# Patient Record
Sex: Female | Born: 1965 | Race: Black or African American | Hispanic: No | Marital: Married | Smoking: Never smoker
Health system: Southern US, Community
[De-identification: ages and names within clinical notes are randomized; demographics above are authoritative.]

## PROBLEM LIST (undated history)

## (undated) DIAGNOSIS — M2550 Pain in unspecified joint: Secondary | ICD-10-CM

## (undated) DIAGNOSIS — M549 Dorsalgia, unspecified: Secondary | ICD-10-CM

## (undated) DIAGNOSIS — E785 Hyperlipidemia, unspecified: Secondary | ICD-10-CM

## (undated) DIAGNOSIS — I10 Essential (primary) hypertension: Secondary | ICD-10-CM

## (undated) DIAGNOSIS — T7840XA Allergy, unspecified, initial encounter: Secondary | ICD-10-CM

## (undated) DIAGNOSIS — E669 Obesity, unspecified: Secondary | ICD-10-CM

## (undated) HISTORY — PX: ABDOMINAL SURGERY: SHX537

## (undated) HISTORY — DX: Dorsalgia, unspecified: M54.9

## (undated) HISTORY — DX: Obesity, unspecified: E66.9

## (undated) HISTORY — PX: CHOLECYSTECTOMY: SHX55

## (undated) HISTORY — PX: ABDOMINAL HYSTERECTOMY: SHX81

## (undated) HISTORY — DX: Hyperlipidemia, unspecified: E78.5

## (undated) HISTORY — PX: OTHER SURGICAL HISTORY: SHX169

## (undated) HISTORY — DX: Pain in unspecified joint: M25.50

## (undated) HISTORY — DX: Allergy, unspecified, initial encounter: T78.40XA

## (undated) HISTORY — DX: Essential (primary) hypertension: I10

## (undated) NOTE — *Deleted (*Deleted)
Health Maintenance Due  Topic Date Due  . INFLUENZA VACCINE  02/08/2020  Please stop by lab before you go If you have mychart- we will send your results within 3 business days of [place] receiving them.  If you do not have mychart- we will call you about results within 5 business days of [place] receiving them.  *please note we are currently using [facility] which has a longer processing time than [place] typically so labs may not come back as quickly as in the past *please also note that you will see labs on mychart as soon as they post. I will later go in and write notes on them- will say "notes from Dr. Durene Cal"   Depression screen [facility] 2/9 08/04/2019 05/27/2019 04/18/2019  Decreased Interest 0 0 0  Down, Depressed, Hopeless 0 0 0  PHQ - 2 Score 0 0 0  Altered sleeping 0 - -  Tired, decreased energy 1 - -  Change in appetite 0 - -  Feeling bad or failure about yourself  0 - -  Trouble concentrating 1 - -  Moving slowly or fidgety/restless 1 - -  Suicidal thoughts 0 - -  PHQ-9 Score 3 - -  Difficult doing work/chores Not difficult at all - -

---

## 2000-07-20 ENCOUNTER — Emergency Department (HOSPITAL_COMMUNITY): Admission: EM | Admit: 2000-07-20 | Discharge: 2000-07-20 | Payer: Self-pay | Admitting: Emergency Medicine

## 2000-08-29 ENCOUNTER — Other Ambulatory Visit: Admission: RE | Admit: 2000-08-29 | Discharge: 2000-08-29 | Payer: Self-pay | Admitting: Gynecology

## 2000-10-12 ENCOUNTER — Encounter: Payer: Self-pay | Admitting: Gastroenterology

## 2000-10-12 ENCOUNTER — Ambulatory Visit (HOSPITAL_COMMUNITY): Admission: RE | Admit: 2000-10-12 | Discharge: 2000-10-12 | Payer: Self-pay | Admitting: Gastroenterology

## 2000-11-15 ENCOUNTER — Encounter (INDEPENDENT_AMBULATORY_CARE_PROVIDER_SITE_OTHER): Payer: Self-pay | Admitting: Specialist

## 2000-11-15 ENCOUNTER — Observation Stay (HOSPITAL_COMMUNITY): Admission: RE | Admit: 2000-11-15 | Discharge: 2000-11-16 | Payer: Self-pay | Admitting: General Surgery

## 2001-08-27 ENCOUNTER — Other Ambulatory Visit: Admission: RE | Admit: 2001-08-27 | Discharge: 2001-08-27 | Payer: Self-pay | Admitting: Gynecology

## 2002-09-22 ENCOUNTER — Other Ambulatory Visit: Admission: RE | Admit: 2002-09-22 | Discharge: 2002-09-22 | Payer: Self-pay | Admitting: Gynecology

## 2003-02-20 ENCOUNTER — Encounter (INDEPENDENT_AMBULATORY_CARE_PROVIDER_SITE_OTHER): Payer: Self-pay | Admitting: *Deleted

## 2003-02-20 ENCOUNTER — Inpatient Hospital Stay (HOSPITAL_COMMUNITY): Admission: RE | Admit: 2003-02-20 | Discharge: 2003-02-23 | Payer: Self-pay | Admitting: Gynecology

## 2004-09-23 ENCOUNTER — Other Ambulatory Visit: Admission: RE | Admit: 2004-09-23 | Discharge: 2004-09-23 | Payer: Self-pay | Admitting: Gynecology

## 2005-09-25 ENCOUNTER — Other Ambulatory Visit: Admission: RE | Admit: 2005-09-25 | Discharge: 2005-09-25 | Payer: Self-pay | Admitting: Gynecology

## 2007-09-26 ENCOUNTER — Other Ambulatory Visit: Admission: RE | Admit: 2007-09-26 | Discharge: 2007-09-26 | Payer: Self-pay | Admitting: Gynecology

## 2007-10-25 ENCOUNTER — Encounter: Admission: RE | Admit: 2007-10-25 | Discharge: 2007-10-25 | Payer: Self-pay | Admitting: Gynecology

## 2008-04-23 ENCOUNTER — Ambulatory Visit: Payer: Self-pay | Admitting: Women's Health

## 2008-12-10 ENCOUNTER — Encounter: Admission: RE | Admit: 2008-12-10 | Discharge: 2008-12-10 | Payer: Self-pay | Admitting: Obstetrics & Gynecology

## 2009-12-14 ENCOUNTER — Encounter: Admission: RE | Admit: 2009-12-14 | Discharge: 2009-12-14 | Payer: Self-pay | Admitting: Obstetrics & Gynecology

## 2010-11-16 ENCOUNTER — Other Ambulatory Visit: Payer: Self-pay | Admitting: Obstetrics & Gynecology

## 2010-11-25 NOTE — Op Note (Signed)
NAME:  [name] [name], [name] [name]                           ACCOUNT NO.:  [id number]   MEDICAL RECORD NO.:  [id number]                   PATIENT TYPE:  INP   LOCATION:  9303                                 FACILITY:  [facility]   PHYSICIAN:  Yaakov Guthrie. Shon Hough, M.D.           DATE OF BIRTH:  Sep 18, 1965   DATE OF PROCEDURE:  02/20/2003  DATE OF DISCHARGE:                                 OPERATIVE REPORT   PATIENT IDENTIFICATION:  This is a 45 year old lady with severe  panniculitis, abdominal dermatochalasis with severe diastasis recti.  She  has increased back pain secondary to a large flow of the tissue in the lower  back and abdominal area, and an abnormal vector.  She has a history of  intertrigo breakouts, using creams, talcs, etc., to no avail.  She also has  increased paunchiness of her belly secondary to severe stretching of her  abdominal wall muscles from previous pregnancies.   PROCEDURES DONE:  1. Abdominoplasty.  2. Panniculectomy.  3. Repair of diastasis recti.   SURGEON:  Yaakov Guthrie. Shon Hough, M.D.   ASSISTANT:  Alethia Berthold, CFA, CO PA-C   ANESTHESIA:  General.   DESCRIPTION OF PROCEDURE:  The patient underwent hysterectomy by Dr. Farrel Gobble  prior to my procedure.  The abdominal wall was then reprepped again with  Hibiclens solution, walled off with sterile towels and draped so as to make  a sterile field.  Abdominoplasty incision was outlined and tumescent  solution injected throughout the tissue.  Abdominoplasty incision was opened  down through skin and subcutaneous tissue with a #15 blade; down through  Scarpa's fascia.  Hemostasis was maintained with a Bovie unit on  coagulation.  Next the flap was dissected over the areolar tissue of the  fascia of the musculature of the abdominal wall.  This was dissected up to  the umbilicus, umbilicus released from the soft pedicles.  Then the  dissection was carried up to the xiphoid process as well as the upper right  and left upper  quadrants.  After acquiring hemostasis, the severe diastasis  recti was examined and then was repaired with a running suture of #1  Prolene.  At the xiphoid process down to the umbilicus, from the umbilicus  down to the suprapubic area.  After allowing for hemostasis, the patient was  placed in a jackknife position.  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  After being happy with the symmetry, subcutaneous closure  was done on the flaps with 2-0 Monocryl x2 layers, and a running  subcuticular stitches of 3-0 Monocryl.  The wound was then drained with a  large Hemovac, which was threaded throughout the wound and brought out  through the suprapubic area; then secured with 3-0 Prolene.  A  new area was  made through the abdominal area and the umbilicus was also pushed back  through and secured with 3-0 Vicryl; and then a running subcuticular stitch  with 3-0 Vicryl.  Steri-Strips and soft dressings were applied about the  areas.  She withstood the procedure very well and was taken to recovery in  excellent condition.    ESTIMATED BLOOD LOSS:  150 cc.   COMPLICATIONS:  None.                                               Yaakov Guthrie. Shon Hough, M.D.    GLT/MEDQ  D:  02/20/2003  T:  02/21/2003  Job:  [id number]   cc:   ATTN Dr. Shon Hough [facility]   Ivor Costa. Farrel Gobble, M.D.  [address], [address]  [place]  [place] [address]  Fax: [phone]

## 2010-11-25 NOTE — Discharge Summary (Signed)
   NAME:  [name] [name], [name] [name]                           ACCOUNT NO.:  [id number]   MEDICAL RECORD NO.:  [id number]                   PATIENT TYPE:  INP   LOCATION:  9144                                 [facility]ACILITY:  [facility]   PHYSICIAN:  Ivor Costa. Farrel Gobble, M.D.              DATE OF BIRTH:  02-12-1966   DATE OF ADMISSION:  02/20/2003  DATE OF DISCHARGE:  02/23/2003                                 DISCHARGE SUMMARY   DISCHARGE DIAGNOSIS:  Symptomatic fibroid uterus status post total abdominal  hysterectomy, left salpingo-oophorectomy by Dr. Douglass Rivers on 02/20/2003.   HISTORY OF PRESENT ILLNESS:  The patient is a 45 year old female gravida 3,  para 2 with a large symptomatic fibroid uterus with significant urinary  frequency secondary to pressure complaining of clotting, changing pads on a  very frequent basis.  Also has had a history of anemia for a number of years  instead of taking iron supplementation.  She is status post Depo Lupron  therapy for the past four months in order to prepare for surgery.   HOSPITAL COURSE:  On 02/20/2003, the patient was admitted and underwent a  total abdominal hysterectomy with left salpingo-oophorectomy by Dr. Douglass Rivers.  Simultaneously, she did have plastic surgery by Dr. Louisa Second, with abdominoplasty, panniculectomy, and repair of diastasis  recti.  Postoperatively, the patient remained afebrile, voiding, and in  stable condition.  Her wound care was managed by Dr. Shon Hough, plastic  surgeon, and she was stable for discharge on 02/23/2003 in satisfactory  condition.   LABORATORY DATA:  Hemoglobin 10 on 02/21/2003.   DISPOSITION:  The patient is discharged to home.   FOLLOW UP:  She is to return to the office in two weeks for postoperative  visit with GYN.  Note:  She is going to follow up with Dr. Shon Hough after  discharge on 02/24/2003 with a binder in place.   DISCHARGE MEDICATIONS:  She was given a prescription for Tylox p.r.n.  pain.      Susa Loffler, P.A.                    Ivor Costa. Farrel Gobble, M.D.    TSG/MEDQ  D:  03/10/2003  T:  03/10/2003  Job:  [id number]

## 2010-11-25 NOTE — Op Note (Signed)
NAME:  [name] [name], [name] [name]                           ACCOUNT NO.:  [id number]   MEDICAL RECORD NO.:  [id number]                   PATIENT TYPE:  INP   LOCATION:  9399                                 [facility]ILITY[facility]   PHYSICIAN:  Ivor Costa. Farrel Gobble, M.D.              DATE OF BIRTH:  April 13, 1966   DATE OF PROCEDURE:  02/20/2003  DATE OF DISCHARGE:                                 OPERATIVE REPORT   PREOPERATIVE DIAGNOSIS:  Fibroid uterus.   POSTOPERATIVE DIAGNOSIS:  Fibroid uterus.   PROCEDURE:  Total abdominal hysterectomy, left salpingo-oophorectomy.   SURGEON:  Ivor Costa. Farrel Gobble, M.D.   ASSISTANT:  Rande Brunt. Eda Paschal, M.D.   ANESTHESIA:  General.   FLUIDS REPLACED:  2 liters lactated ringers.   ESTIMATED BLOOD LOSS:  200 mL.   URINE OUTPUT:  350 mL clear.   FINDINGS:  Multifibroid uterus with distorted anatomy.   PATHOLOGY:  Uterus, cervix, left tube and ovary.   COMPLICATIONS:  None.   PROCEDURE:  The patient was taken to the operating room, general anesthesia  was induced, placed in the supine position, prepped and draped in the usual  sterile fashion.  A Pfannenstiel skin incision was made with the scalpel  going through the previous C-section scar X 2 and carried through the  underlying layer of fascia which was scored in the midline.  The fascial  incision was then extended laterally.  The rectus muscles were identified  and scored with the electrocautery with careful attention to any of the  dissecting vessels.  The rectus muscles were then deviated medially at the  edge.  The inferior epigastric vessels were visualized, elevated,  crossclamped, transected, and suture ligated with 0 Vicryl.  This was  performed bilaterally.  After this point, the muscle dissection was extended  to the lateral edge.  This was also performed bilaterally.  The muscles were  noted to be hemostatic.  The peritoneum was identified, entered bluntly, and  then the peritoneal incision in a  similar fashion was also extended  laterally in line with the incision.  The uterus was visualized.  The uterus  was able to be exteriorized.   The anatomy was markedly distorted secondary to the fibroids.  The left tube  had evidence of the previous tubal ligation.  The round ligament on the left  was really unable to be identified initially.  The ovary was markedly  scarred to what was felt to be the fundal region.  After some inspection,  the round ligament was identified, crossclamped, and then sharply dissected  with the electrocautery.  The anterior leaf of the broad ligament was  partially incised as was the posterior.  It was felt that the ovary was  markedly scarred and needed to be sharply dissected off the uterine fundus.  The ovary was elevated and a Heaney clamp was placed underneath.  A second  Heaney was placed for  back bleeding.  The ovary was begun to be sharply  dissected off.  The tubo-ovarian ligament was never truly identified and  while the ovary was carefully being dissected off, we noted that we had  incidentally lost the ovarian blood supply and it began to get dusky.  Based  on that, the ovary was sharply dissected off.  At this point, we had not  identified the infundibulopelvic ligament, however, there was no bleeding.  The uterus was then extra rotated, the anatomy on the sidewall was  distorted, it was certainly easier.  The round ligament was grasped and  transected with the electrocautery and noted to be hemostatic.  The anterior  leaf of the broad ligament was incised and carried through and although the  dissection on the left side appeared to be markedly lateral, it was apparent  that it did properly create the bladder flap because of distortion.  The  bladder nicely came down sharply.  The tube was also close to the uterus,  however, the tubo-ovarian ligament was able to be identified, this was  clamped, transected, free tied and suture ligature of 0  Vicryl were placed.  The ovary was then able to be dissected off the uterus with careful  attention to its vascular supply.  The posterior leaf of the broad ligament  was then incised and the ovary was able to be safely packed away.  Of note,  the ureter was identified on the left prior to removal of the ovary because  we felt that we could not actually secure the IP ligament.   The attention was turned to the left hand side.  We never saw any bleeding,  but we had sacrificed the IP and there was no hematoma forming.  The  attention was turned back towards the right as the anatomy was a little  easier to identify.  The fibroid was stabilized and elevated.  The uterine  vessels were then able to be visualized.  The peritoneum was sharply taken  down with careful attention to the uterine vessels which, at this point had  not been identified.  Once the anterior and posterior leaf of the broad  ligament were further taken down and we were clear of the bladder  anteriorly, a Masterson was placed across what was felt to be the uterine  vessels which were transected and suture ligated.  There was a marked amount  of back bleeding.  The uterus was injected with dilute Pitressin on the left  hand side so that we could better visualize the anatomy.  The patient was  given a total of 10 mL of a dilute solution of 10 to 50.  We were able to  identify the fact that the infundibulopelvic ligament had been transected  and suture ligated with the round and was markedly deviated anteriorly as  the uterus had been markedly rotated.  What we felt was a broad ligament  fibroid on the left in reality turned out to be the cervix markedly deviated  towards the patient's left.  Onc we secured this, the round ligaments were  able to be identified, cross clamped, suture ligated with 0 Vicryl.  Once  both round ligaments were secured, the fundus was dissected off and the anatomy was certainly markedly easier to  identify.  The cardinal ligaments  were then sharply dissected down, suture ligated with 0 Vicryl bilaterally.  The dissection was carried through to include the uterosacral ligaments.  The vagina was then entered  sharply and the cervix was sharply dissected  off.  The vaginal mucosa was transfixed to the ipsilateral cardinal ligament  and everted.  This was done bilaterally.  The vagina was closed in a  baseball fashion with 0 Vicryl.   The pelvis was then irrigated with copious amounts of warm saline, as the  anatomy on the left had been somewhat distorted, the ureter was again  visualized and peristalsis was noted.  The pedicles were noted, there was a  small amount of bleeding at the left angle that was treated with a figure-of-  eight of 0 Vicryl.  The pedicles on the right were also inspected and noted  to be hemostatic.  Of note, after the fundus was removed, the Lenox Ahr was placed in the incision because it was a Maylard incision.  We  placed a lap sponge laterally to avoid pressure on the psoas muscle.  The  pelvis was then irrigated with copious amounts of warm saline.  Reinspection  of the pedicle insured [place] of hemostasis.  The retractor was then removed.  The peritoneum was grasped with Kelly clamps and was plicated in the  midline, however, it was markedly thinned and noted to rip.  This was,  therefore, only carried through to the mid portion.  The fascia, muscle, and  peritoneum were closed together starting at the left angle, the right angle,  to the midline and similarly on the left, although some of the peritoneum  had been previously closed.  The incision was irrigated, hemostasis was  assured.  It was left open as Dr. Shon Hough had come in at this point to  start an abdominoplasty.                                              Ivor Costa. Farrel Gobble, M.D.   THL/MEDQ  D:  02/20/2003  T:  02/20/2003  Job:  [id number]

## 2010-11-25 NOTE — H&P (Signed)
NAME:  [name], [name]                           ACCOUNT NO.:  [id number]   MEDICAL RECORD NO.:  [id number]                   PATIENT TYPE:  AMB   LOCATION:  SDC                                  [facility]   PHYSICIAN:  Ivor Costa. Farrel Gobble, M.D.              DATE OF BIRTH:  [birth year]   DATE OF ADMISSION:  02/20/2003  DATE OF DISCHARGE:                                HISTORY & PHYSICAL   CHIEF COMPLAINT:  Symptomatic fibroid uterus.   HISTORY OF PRESENT ILLNESS:  The patient is a 45 year old G3, P2 with large  symptomatic fibroid uterus.  The patient states that she has urinary  frequency secondary to pressure.  She also states that her periods last for  about seven days.  She complains of clots and changing pads on a frequent  basis.  She has had mild anemia for a number of years despite taking iron  supplementation which is felt to be secondary to her fibroids.  The  patient's hemoglobin runs in the range of approximately 10.  The patient  elects to undergo definitive surgery in the form of an abdominal  hysterectomy.  She is status post Lupron Depot therapy for the past four  months in order to prepare her for surgery.  Currently the patient only  complains of hot flashes and night sweats.  Her urinary symptoms are  somewhat better.  She has not noticed any vaginal discharge.   PAST OB/GYN HISTORY:  1. Two cesarean sections.  2. Menses as above.  3. Pap smear in March 2004 was normal.  She has no history of abnormal Pap     smears.  4. Her contraception is a tubal ligation.   PAST MEDICAL HISTORY:  Negative.   PAST SURGICAL HISTORY:  1. Significant for cesarean sections in 1987 and 1993.  2. She had a cholecystectomy done in 2002, laparoscopic.   MEDICATIONS:  1. Lupron Depot.  2. Iron.   ALLERGIES:  Negative.   SOCIAL HISTORY:  Social alcohol.  No tobacco.  No caffeine.  Some exercise.   PHYSICAL EXAMINATION:  GENERAL:  She is a well-appearing female in no acute  distress.  HEART:  Regular rhythm.  LUNGS:  Clear to auscultation.  ABDOMEN:  Soft, nontender without rebound or guarding.  Her uterus is  palpable approximately three finger breadths below the umbilicus.  PELVIC:  She has normal external female genitalia.  The BUS is negative.  The vagina was pink and moist.  The cervix is without lesions.  Bi-manual  exam, the multi-fibroid irregular uterus is palpable three finger breadths  below the umbilicus.  A large fibroid in the lower uterine segment is also  palpable.  Rectovaginal exam was deferred.  EXTREMITIES:  Negative.   Her ultrasound, prior to the Lupron, shows her uterus to be 22 x 9 x 14-cm  with seven distinct fibroids that were visualized that measured between  3.5-  7-cm.  The ovaries were not able to be identified.   ASSESSMENT:  Symptomatic fibroid uterus, status post Lupron Depot.   The patient will present for a total abdominal hysterectomy.  She will also  have an abdominoplasty done at the same time by Dr. Shon Hough, all questions  were addressed and she will present electively in the morning for surgery.  Her CBC is pending at the time of this dictation.                                               Ivor Costa. Farrel Gobble, M.D.    THL/MEDQ  D:  02/11/2003  T:  02/11/2003  Job:  [id number]

## 2010-11-25 NOTE — Op Note (Signed)
[facility]  Patient:    [name] [name], [name] [name]                       MRN: [id number] Proc. Date: 11/15/00 Adm. Date:  [id number] Attending:  Arlis Porta CC:         Douglass Rivers, M.D.  Anselmo Rod, M.D.   Operative Report  PREOPERATIVE DIAGNOSIS:  Biliary dyskinesia.  POSTOPERATIVE DIAGNOSIS:  Biliary dyskinesia.  PROCEDURE:  Laparoscopic cholecystectomy.  SURGEON:  Dr. Abbey Chatters.  ASSISTANT:  Cyndia Bent, M.D.  ANESTHESIA:  General.  INDICATIONS FOR PROCEDURE:  This 45 year old female has post prandial right upper quadrant epigastric pain that radiates around to her right flank. She has tried proton pump inhibitors and this has not helped her pain. It tends to be particularly bad after eating a spicy meal or a fatty meal. An abdominal ultrasound demonstrated no gallstones; however, a nuclear medicine hepatobiliary scan demonstrated an abnormal ejection fraction. Findings of this were consistent with biliary dyskinesia. She now presents for elective cholecystectomy. Her preoperative liver function tests are normal. She is noted to have a preop hemoglobin of 10.5; however she has a chronic anemia.  TECHNIQUE:  She is placed supine on the operating table and a general anesthetic was administered. The abdomen was sterilely prepped and draped. A local anesthetic was infiltrated in the subumbilical region and a 2 cm subumbilical incision was made incising the skin and subcutaneous tissue sharply. The subumbilical fascia was grasped and an incision made in the fascia of 1 cm in size. The incision was dilated and the peritoneal cavity was entered bluntly and under direct vision. A pursestring suture of #0 Vicryl was placed around thea fascial edges. A Hasson trocar was then introduced into the peritoneal cavity and a pneumoperitoneum created by insufflation of CO2 gas. Next a laparoscope was introduced. Lower abdominal adhesions were  noted. She was placed in the appropriate position and under direct vision, an 11 mm trocar is placed through an epigastric incision and two 5 mm trocars were placed through a right abdominal incision. The fundus of the gallbladder was grasped and retracted toward the right shoulder and the infundibulum was grasped and retracted toward the right. Using blunt dissection, I was able to identify the gallbladder and cystic duct junction. There was a very small diameter cystic duct. I isolated the region, clipped it three times proximally and once distally and divided the cystic duct. The cystic artery was identified, clipped and divided. The gallbladder was then dissected free from the liver bed intact. No bile leakage was noted. The liver bed was inspected, irrigated and bleeding points controlled with the cautery. The gallbladder fossa was once again inspected and there was no noted bile leakage or bleeding. The gallbladder was then removed to the subumbilical port. The subumbilical fascial defect was closed by tightening up and tying down the pursestring suture. The rest of the trocars were removed and the pneumoperitoneum was released. Subsequently the skin incisions were closed with 4-0 monocryl subcuticular stitches followed by Steri-Strips and sterile dressings.  She tolerated the procedure well without any apparent complications and was taken to the recovery room in satisfactory condition. DD:  11/15/00 TD:  11/15/00 Job: [id number]

## 2011-01-10 ENCOUNTER — Other Ambulatory Visit: Payer: Self-pay | Admitting: Obstetrics & Gynecology

## 2011-01-10 DIAGNOSIS — Z1231 Encounter for screening mammogram for malignant neoplasm of breast: Secondary | ICD-10-CM

## 2011-01-16 ENCOUNTER — Ambulatory Visit: Payer: Self-pay

## 2011-01-18 ENCOUNTER — Ambulatory Visit
Admission: RE | Admit: 2011-01-18 | Discharge: 2011-01-18 | Disposition: A | Discharge status: Home / Self Care | Payer: BC Managed Care – PPO | Source: Ambulatory Visit | Attending: Obstetrics & Gynecology | Admitting: Obstetrics & Gynecology

## 2011-01-18 DIAGNOSIS — Z1231 Encounter for screening mammogram for malignant neoplasm of breast: Secondary | ICD-10-CM

## 2013-10-15 ENCOUNTER — Other Ambulatory Visit: Payer: Self-pay

## 2013-10-15 DIAGNOSIS — Z1231 Encounter for screening mammogram for malignant neoplasm of breast: Secondary | ICD-10-CM

## 2013-10-20 ENCOUNTER — Ambulatory Visit: Payer: BC Managed Care – PPO

## 2013-10-27 ENCOUNTER — Ambulatory Visit
Admission: RE | Admit: 2013-10-27 | Discharge: 2013-10-27 | Disposition: A | Discharge status: Home / Self Care | Payer: BC Managed Care – PPO | Source: Ambulatory Visit

## 2013-10-27 DIAGNOSIS — Z1231 Encounter for screening mammogram for malignant neoplasm of breast: Secondary | ICD-10-CM

## 2014-04-21 ENCOUNTER — Encounter: Payer: Self-pay | Admitting: Family

## 2014-04-21 ENCOUNTER — Ambulatory Visit (INDEPENDENT_AMBULATORY_CARE_PROVIDER_SITE_OTHER): Payer: BC Managed Care – PPO | Admitting: Family

## 2014-04-21 VITALS — BP 118/80 | HR 67 | Ht 66.75 in | Wt 217.0 lb

## 2014-04-21 DIAGNOSIS — Z23 Encounter for immunization: Secondary | ICD-10-CM

## 2014-04-21 DIAGNOSIS — Z0000 Encounter for general adult medical examination without abnormal findings: Secondary | ICD-10-CM

## 2014-04-21 LAB — POCT URINALYSIS DIPSTICK
BILIRUBIN UA: NEGATIVE
Glucose, UA: NEGATIVE
KETONES UA: NEGATIVE
LEUKOCYTES UA: NEGATIVE
Nitrite, UA: NEGATIVE
PH UA: 5.5
Protein, UA: NEGATIVE
Spec Grav, UA: 1.01
Urobilinogen, UA: 0.2

## 2014-04-21 LAB — CBC WITH DIFFERENTIAL/PLATELET
BASOS ABS: 0 10*3/uL (ref 0.0–0.1)
Basophils Relative: 0.8 % (ref 0.0–3.0)
Eosinophils Absolute: 0.2 10*3/uL (ref 0.0–0.7)
Eosinophils Relative: 3.9 % (ref 0.0–5.0)
HEMATOCRIT: 37.6 % (ref 36.0–46.0)
Hemoglobin: 12.2 g/dL (ref 12.0–15.0)
LYMPHS ABS: 2.6 10*3/uL (ref 0.7–4.0)
Lymphocytes Relative: 47 % — ABNORMAL HIGH (ref 12.0–46.0)
MCHC: 32.5 g/dL (ref 30.0–36.0)
MCV: 92.4 fl (ref 78.0–100.0)
MONO ABS: 0.3 10*3/uL (ref 0.1–1.0)
MONOS PCT: 5.9 % (ref 3.0–12.0)
Neutro Abs: 2.3 10*3/uL (ref 1.4–7.7)
Neutrophils Relative %: 42.4 % — ABNORMAL LOW (ref 43.0–77.0)
PLATELETS: 247 10*3/uL (ref 150.0–400.0)
RBC: 4.07 Mil/uL (ref 3.87–5.11)
RDW: 13.1 % (ref 11.5–15.5)
WBC: 5.5 10*3/uL (ref 4.0–10.5)

## 2014-04-21 LAB — COMPREHENSIVE METABOLIC PANEL
ALK PHOS: 67 U/L (ref 39–117)
ALT: 19 U/L (ref 0–35)
AST: 19 U/L (ref 0–37)
Albumin: 3.5 g/dL (ref 3.5–5.2)
BILIRUBIN TOTAL: 0.5 mg/dL (ref 0.2–1.2)
BUN: 13 mg/dL (ref 6–23)
CO2: 28 mEq/L (ref 19–32)
Calcium: 9.8 mg/dL (ref 8.4–10.5)
Chloride: 106 mEq/L (ref 96–112)
Creatinine, Ser: 0.8 mg/dL (ref 0.4–1.2)
GFR: 102.63 mL/min (ref >60.00)
Glucose, Bld: 92 mg/dL (ref 70–99)
Potassium: 4.2 mEq/L (ref 3.5–5.1)
SODIUM: 140 meq/L (ref 135–145)
TOTAL PROTEIN: 7.9 g/dL (ref 6.0–8.3)

## 2014-04-21 LAB — LIPID PANEL
Cholesterol: 178 mg/dL (ref 0–200)
HDL: 41.4 mg/dL (ref >39.00)
LDL Cholesterol: 118 mg/dL — ABNORMAL HIGH (ref 0–99)
NONHDL: 136.6
Total CHOL/HDL Ratio: 4
Triglycerides: 91 mg/dL (ref 0.0–149.0)
VLDL: 18.2 mg/dL (ref 0.0–40.0)

## 2014-04-21 LAB — TSH: TSH: 1.46 u[IU]/mL (ref 0.35–4.50)

## 2014-04-21 NOTE — Progress Notes (Signed)
Pre visit review using our clinic review tool, if applicable. No additional management support is needed unless otherwise documented below in the visit note. 

## 2014-04-21 NOTE — Progress Notes (Signed)
   Subjective:    Patient ID: [name] [name], female    DOB: 07/21/1965, 48 y.o.   MRN: [id number]  HPI  48 year old [place], nonsmoker, is in today to be established and for complete physical exam. She denies any concerns. Sees gynecology for female care. Last Pap smear and mammogram were normal in April 2015. Does not routinely exercise.  Review of Systems  Constitutional: Negative.   HENT: Negative.   Eyes: Negative.   Respiratory: Negative.   Cardiovascular: Negative.   Gastrointestinal: Negative.   Endocrine: Negative.   Genitourinary: Negative.   Musculoskeletal: Negative.   Skin: Negative.   Allergic/Immunologic: Negative.   Neurological: Negative.   Hematological: Negative.   Psychiatric/Behavioral: Negative.    Past Medical History  Diagnosis Date  . Hypertension   . Hyperlipidemia     History   Social History  . Marital Status: Divorced    Spouse Name: N/A    Number of Children: N/A  . Years of Education: N/A   Occupational History  . Not on file.   Social History Main Topics  . Smoking status: Never Smoker   . Smokeless tobacco: Not on file  . Alcohol Use: Yes  . Drug Use: No  . Sexual Activity: Not on file   Other Topics Concern  . Not on file   Social History Narrative  . No narrative on file    Past Surgical History  Procedure Laterality Date  . Cholecystectomy    . Abdominal hysterectomy      Family History  Problem Relation Age of Onset  . Heart disease Mother   . Heart attack Mother   . Hypertension Mother   . Diabetes Mother   . Diabetes Sister     No Known Allergies  No current outpatient prescriptions on file prior to visit.   No current facility-administered medications on file prior to visit.    BP 118/80  Pulse 67  Ht 5' 6.75" (1.695 m)  Wt 217 lb (98.431 kg)  BMI 34.26 kg/m2chart    Objective:   Physical Exam  Constitutional: She is oriented to person, place, and time. She appears  well-developed and well-nourished.  HENT:  Head: Normocephalic and atraumatic.  Right Ear: External ear normal.  Left Ear: External ear normal.  Nose: Nose normal.  Mouth/Throat: Oropharynx is clear and moist.  Eyes: Conjunctivae and EOM are normal. Pupils are equal, round, and reactive to light.  Neck: Normal range of motion. Neck supple. No thyromegaly present.  Cardiovascular: Normal rate, regular rhythm and normal heart sounds.   Pulmonary/Chest: Effort normal and breath sounds normal.  Abdominal: Soft. Bowel sounds are normal.  Genitourinary:  Deferred to GYN  Musculoskeletal: Normal range of motion. She exhibits no edema and no tenderness.  Neurological: She is alert and oriented to person, place, and time. She has normal reflexes. She displays normal reflexes. No cranial nerve deficit. Coordination normal.  Skin: Skin is warm and dry.  Psychiatric: She has a normal mood and affect.          Assessment & Plan:  [name] [name] was seen today for establish care.  Diagnoses and associated orders for this visit:  Preventative health care - EKG 12-Lead - Lipid Panel - CBC with Differential - CMP - POC Urinalysis Dipstick - TSH    Encouraged a healthy diet and exercise, monthly self breast exams. Heart healthy diet. Call the office with any questions or concerns. Recheck

## 2014-04-21 NOTE — Addendum Note (Signed)
Addended by: Beverely LowFRAZIER, Heywood Tokunaga L on: 04/21/2014 10:11 AM   Modules accepted: Orders

## 2014-04-21 NOTE — Patient Instructions (Signed)
Exercise to Stay Healthy Exercise helps you become and stay healthy. EXERCISE IDEAS AND TIPS Choose exercises that:  You enjoy.  Fit into your day. You do not need to exercise really hard to be healthy. You can do exercises at a slow or medium level and stay healthy. You can:  Stretch before and after working out.  Try yoga, Pilates, or tai chi.  Lift weights.  Walk fast, swim, jog, run, climb stairs, bicycle, dance, or rollerskate.  Take aerobic classes. Exercises that burn about 150 calories:  Running 1  miles in 15 minutes.  Playing volleyball for 45 to 60 minutes.  Washing and waxing a car for 45 to 60 minutes.  Playing touch football for 45 minutes.  Walking 1  miles in 35 minutes.  Pushing a stroller 1  miles in 30 minutes.  Playing basketball for 30 minutes.  Raking leaves for 30 minutes.  Bicycling 5 miles in 30 minutes.  Walking 2 miles in 30 minutes.  Dancing for 30 minutes.  Shoveling snow for 15 minutes.  Swimming laps for 20 minutes.  Walking up stairs for 15 minutes.  Bicycling 4 miles in 15 minutes.  Gardening for 30 to 45 minutes.  Jumping rope for 15 minutes.  Washing windows or floors for 45 to 60 minutes. Document Released: 07/29/2010 Document Revised: 09/18/2011 Document Reviewed: 07/29/2010 ExitCare Patient Information 2015 [employer]. This information is not intended to replace advice given to you by your health care provider. Make sure you discuss any questions you have with your health care provider.  

## 2014-04-29 ENCOUNTER — Telehealth: Payer: Self-pay | Admitting: Family

## 2014-04-29 NOTE — Telephone Encounter (Signed)
Pt is aware.  

## 2014-04-29 NOTE — Telephone Encounter (Signed)
Pt would like to know if she needs to have another Pertussis vaccination??

## 2014-04-29 NOTE — Telephone Encounter (Signed)
No. She just had one 04/21/2014.

## 2014-05-11 ENCOUNTER — Encounter: Payer: Self-pay | Admitting: Family

## 2014-06-16 ENCOUNTER — Telehealth: Payer: Self-pay | Admitting: Family

## 2014-06-16 NOTE — Telephone Encounter (Signed)
Pt called to ask if [name] [name] would write her a rx for some weight loss pills.

## 2014-06-16 NOTE — Telephone Encounter (Signed)
Needs appointment

## 2014-06-17 NOTE — Telephone Encounter (Signed)
S/w pt she will call back next week and schedule an appt .

## 2014-10-15 ENCOUNTER — Ambulatory Visit: Payer: Self-pay | Admitting: Family Medicine

## 2014-10-15 DIAGNOSIS — Z0289 Encounter for other administrative examinations: Secondary | ICD-10-CM

## 2014-10-20 ENCOUNTER — Ambulatory Visit (INDEPENDENT_AMBULATORY_CARE_PROVIDER_SITE_OTHER): Payer: BLUE CROSS/BLUE SHIELD | Admitting: Family Medicine

## 2014-10-20 ENCOUNTER — Encounter: Payer: Self-pay | Admitting: Family Medicine

## 2014-10-20 VITALS — BP 142/92 | HR 64 | Temp 98.3°F | Wt 217.0 lb

## 2014-10-20 DIAGNOSIS — E785 Hyperlipidemia, unspecified: Secondary | ICD-10-CM | POA: Diagnosis not present

## 2014-10-20 DIAGNOSIS — R312 Other microscopic hematuria: Secondary | ICD-10-CM

## 2014-10-20 DIAGNOSIS — E669 Obesity, unspecified: Secondary | ICD-10-CM | POA: Diagnosis not present

## 2014-10-20 DIAGNOSIS — R030 Elevated blood-pressure reading, without diagnosis of hypertension: Secondary | ICD-10-CM | POA: Diagnosis not present

## 2014-10-20 DIAGNOSIS — R3129 Other microscopic hematuria: Secondary | ICD-10-CM

## 2014-10-20 LAB — URINALYSIS, MICROSCOPIC ONLY: RBC / HPF: NONE SEEN (ref 0–?)

## 2014-10-20 NOTE — Progress Notes (Signed)
  [name] ConchStephen Hunter, MD Phone: [phone]  Subjective:  Patient presents today to establish care with me as their new primary care provider. Patient was formerly a patient of Dr. Orvan Falconerampbell DNP. Chief complaint-noted.   Hyperlipidemia-mild poor control  Obesity- poor control Lab Results  Component Value Date   LDLCALC 118* 04/21/2014   On statin: no Regular exercise: no Diet: poor choices ROS- no chest pain or shortness of breath. No myalgias  Elevated blood pressure-poor control  BP Readings from Last 3 Encounters:  10/20/14 142/92  04/21/14 118/80   Home BP monitoring-no Enjoys going to track 0-1x a week.  Compliant with medications-no rx.  ROS-Denies any CP, HA, SOB, blurry vision, LE edema, transient weakness, orthopnea, PND.   The following were reviewed and entered/updated in epic: Past Medical History  Diagnosis Date  . Hypertension   . Hyperlipidemia    Patient Active Problem List   Diagnosis Date Noted  . Hyperlipidemia 10/20/2014   Past Surgical History  Procedure Laterality Date  . Cholecystectomy    . Abdominal hysterectomy      Family History  Problem Relation Age of Onset  . Heart disease Mother   . Heart attack Mother   . Hypertension Mother   . Diabetes Mother   . Diabetes Sister    Medications- reviewed and updated. No current rx  Allergies-reviewed and updated No Known Allergies  History   Social History  . Marital Status: Divorced    Spouse Name: N/A  . Number of Children: N/A  . Years of Education: N/A   Social History Main Topics  . Smoking status: Never Smoker   . Smokeless tobacco: Not on file  . Alcohol Use: Yes  . Drug Use: No  . Sexual Activity: Not on file   Other Topics Concern  . None   Social History Narrative    ROS--See HPI   Objective: BP 142/92 mmHg  Pulse 64  Temp(Src) 98.3 F (36.8 C)  Wt 217 lb (98.431 kg) Gen: NAD, resting comfortably on table CV: RRR no murmurs rubs or gallops Lungs: CTAB no  crackles, wheeze, rhonchi Abdomen: soft/nontender/nondistended/normal bowel sounds. No rebound or guarding.  Ext: no edema Skin: warm, dry, no rash Neuro: grossly normal, moves all extremities, normal gait  Assessment/Plan:  Hyperlipidemia Obesity LDL 110s. 10 year cva/mi risk <2% in 2016. Discussed need for healthy eating/exercise/weight loss to prevent need for medication in the future. I may use a 5% cut off for patient with family history of MI in her mother in 40s-patient currently asymptomatic.   Hematuria on dipstick.  S: 1+ blood on dipstick, does not have periods.  A/P: check urine micro  Elevated blood pressure-poor control . Technically 1/2 readings so does not meet qualifications for HTN.  -advised dash diet, weight loss in relation to BP, HLD, and obesity.   Dating since last May. Has not had STD testing but likely will have through ob/gyn and declines today. We requested records to include HIV and PAP to update her HM. Technically does not need pap given hysterectomy for benign reasons (fibroid) and cervix removedh  3 month follow up. 10 lbs down weight loss.   Orders Placed This Encounter  Procedures  . Urine Microscopic Only   >50% of 25 minute office visit was spent on counseling (diet and weight loss and exercise in relation to HLD, HTN risk, obesity) and coordination of care

## 2014-10-20 NOTE — Assessment & Plan Note (Signed)
LDL 110s. 10 year cva/mi risk <2% in 2016. Discussed need for healthy eating/exercise/weight loss to prevent need for medication in the future. I may use a 5% cut off for patient with family history of MI in her mother in 40s-patient currently asymptomatic.

## 2014-10-20 NOTE — Patient Instructions (Addendum)
Sign release of information at the front desk for your ob/gyn. I only need mammogram reports for last 2 years, last 5 years of pap smears, and any STD testing.  Health Maintenance Due  Topic Date Due  . HIV Screening  09/09/1980  . PAP SMEAR  09/10/1983   Lets make sure no real blood in your urine before you leave.   Blood pressure slightly up. We need to work on getting this down.  Start with walking a minimum 30 minutes 3x a week but goal 150 minutes.   Let's check back in 3 months from now. Goal at least 10 lbs down by follow up.   DASH Eating Plan DASH stands for "Dietary Approaches to Stop Hypertension." The DASH eating plan is a healthy eating plan that has been shown to reduce high blood pressure (hypertension). Additional health benefits may include reducing the risk of type 2 diabetes mellitus, heart disease, and stroke. The DASH eating plan may also help with weight loss. WHAT DO I NEED TO KNOW ABOUT THE DASH EATING PLAN? For the DASH eating plan, you will follow these general guidelines:  Choose foods with a percent daily value for sodium of less than 5% (as listed on the food label).  Use salt-free seasonings or herbs instead of table salt or sea salt.  Check with your health care provider or pharmacist before using salt substitutes.  Eat lower-sodium products, often labeled as "lower sodium" or "no salt added."  Eat fresh foods.  Eat more vegetables, fruits, and low-fat dairy products.  Choose whole grains. Look for the word "whole" as the first word in the ingredient list.  Choose fish and skinless chicken or [place] more often than red meat. Limit fish, poultry, and meat to 6 oz (170 g) each day.  Limit sweets, desserts, sugars, and sugary drinks.  Choose heart-healthy fats.  Limit cheese to 1 oz (28 g) per day.  Eat more home-cooked food and less restaurant, buffet, and fast food.  Limit fried foods.  Cook foods using methods other than frying.  Limit  canned vegetables. If you do use them, rinse them well to decrease the sodium.  When eating at a restaurant, ask that your food be prepared with less salt, or no salt if possible. WHAT FOODS CAN I EAT? Seek help from a dietitian for individual calorie needs. Grains Whole grain or whole wheat bread. Brown rice. Whole grain or whole wheat pasta. Quinoa, bulgur, and whole grain cereals. Low-sodium cereals. Corn or whole wheat flour tortillas. Whole grain cornbread. Whole grain crackers. Low-sodium crackers. Vegetables Fresh or frozen vegetables (raw, steamed, roasted, or grilled). Low-sodium or reduced-sodium tomato and vegetable juices. Low-sodium or reduced-sodium tomato sauce and paste. Low-sodium or reduced-sodium canned vegetables.  Fruits All fresh, canned (in natural juice), or frozen fruits. Meat and Other Protein Products Ground beef (85% or leaner), grass-fed beef, or beef trimmed of fat. Skinless chicken or [place]. Ground chicken or [place]. Pork trimmed of fat. All fish and seafood. Eggs. Dried beans, peas, or lentils. Unsalted nuts and seeds. Unsalted canned beans. Dairy Low-fat dairy products, such as skim or 1% milk, 2% or reduced-fat cheeses, low-fat ricotta or cottage cheese, or plain low-fat yogurt. Low-sodium or reduced-sodium cheeses. Fats and Oils Tub margarines without trans fats. Light or reduced-fat mayonnaise and salad dressings (reduced sodium). Avocado. Safflower, olive, or canola oils. Natural peanut or almond butter. Other Unsalted popcorn and pretzels. The items listed above may not be a complete list of recommended foods  or beverages. Contact your dietitian for more options. WHAT FOODS ARE NOT RECOMMENDED? Grains White bread. White pasta. White rice. Refined cornbread. Bagels and croissants. Crackers that contain trans fat. Vegetables Creamed or fried vegetables. Vegetables in a cheese sauce. Regular canned vegetables. Regular canned tomato sauce and paste. Regular  tomato and vegetable juices. Fruits Dried fruits. Canned fruit in light or heavy syrup. Fruit juice. Meat and Other Protein Products Fatty cuts of meat. Ribs, chicken wings, bacon, sausage, bologna, salami, chitterlings, fatback, hot dogs, bratwurst, and packaged luncheon meats. Salted nuts and seeds. Canned beans with salt. Dairy Whole or 2% milk, cream, half-and-half, and cream cheese. Whole-fat or sweetened yogurt. Full-fat cheeses or blue cheese. Nondairy creamers and whipped toppings. Processed cheese, cheese spreads, or cheese curds. Condiments Onion and garlic salt, seasoned salt, table salt, and sea salt. Canned and packaged gravies. Worcestershire sauce. Tartar sauce. Barbecue sauce. Teriyaki sauce. Soy sauce, including reduced sodium. Steak sauce. Fish sauce. Oyster sauce. Cocktail sauce. Horseradish. Ketchup and mustard. Meat flavorings and tenderizers. Bouillon cubes. Hot sauce. Tabasco sauce. Marinades. Taco seasonings. Relishes. Fats and Oils Butter, stick margarine, lard, shortening, ghee, and bacon fat. Coconut, palm kernel, or palm oils. Regular salad dressings. Other Pickles and olives. Salted popcorn and pretzels. The items listed above may not be a complete list of foods and beverages to avoid. Contact your dietitian for more information. WHERE CAN I FIND MORE INFORMATION? National Heart, Lung, and Blood Institute: CableProm[employer].it Document Released: 06/15/2011 Document Revised: 11/10/2013 Document Reviewed: 04/30/2013 [facility] Patient Information 2015 [place], [place]. This information is not intended to replace advice given to you by your health care provider. Make sure you discuss any questions you have with your health care provider.

## 2014-10-23 ENCOUNTER — Ambulatory Visit: Payer: Self-pay | Admitting: Family Medicine

## 2016-12-05 DIAGNOSIS — L299 Pruritus, unspecified: Secondary | ICD-10-CM | POA: Diagnosis not present

## 2016-12-05 DIAGNOSIS — L989 Disorder of the skin and subcutaneous tissue, unspecified: Secondary | ICD-10-CM | POA: Diagnosis not present

## 2016-12-05 DIAGNOSIS — W57XXXA Bitten or stung by nonvenomous insect and other nonvenomous arthropods, initial encounter: Secondary | ICD-10-CM | POA: Diagnosis not present

## 2017-04-23 ENCOUNTER — Other Ambulatory Visit: Payer: Self-pay | Admitting: Family Medicine

## 2017-04-23 DIAGNOSIS — Z1231 Encounter for screening mammogram for malignant neoplasm of breast: Secondary | ICD-10-CM

## 2017-05-01 ENCOUNTER — Ambulatory Visit (INDEPENDENT_AMBULATORY_CARE_PROVIDER_SITE_OTHER): Payer: BLUE CROSS/BLUE SHIELD | Admitting: Family Medicine

## 2017-05-01 VITALS — BP 148/90 | HR 75 | Temp 99.0°F | Ht 66.75 in | Wt 228.4 lb

## 2017-05-01 DIAGNOSIS — Z23 Encounter for immunization: Secondary | ICD-10-CM

## 2017-05-01 DIAGNOSIS — Z1211 Encounter for screening for malignant neoplasm of colon: Secondary | ICD-10-CM | POA: Diagnosis not present

## 2017-05-01 DIAGNOSIS — I10 Essential (primary) hypertension: Secondary | ICD-10-CM

## 2017-05-01 DIAGNOSIS — E785 Hyperlipidemia, unspecified: Secondary | ICD-10-CM

## 2017-05-01 DIAGNOSIS — M79601 Pain in right arm: Secondary | ICD-10-CM | POA: Diagnosis not present

## 2017-05-01 NOTE — Addendum Note (Signed)
Addended by: Vicente MalesSOUTHERN HIZER, Sivan Quast M on: 05/01/2017 09:08 AM   Modules accepted: Orders

## 2017-05-01 NOTE — Assessment & Plan Note (Signed)
S: poorly controlled on no rx plus has had weight gain. No myalgias.  Lab Results  Component Value Date   CHOL 178 04/21/2014   HDL 41.40 04/21/2014   LDLCALC 118 (H) 04/21/2014   TRIG 91.0 04/21/2014   CHOLHDL 4 04/21/2014   A/P: Encouraged need for healthy eating, regular exercise, weight loss. Update lipids at cpe

## 2017-05-01 NOTE — Patient Instructions (Addendum)
We will call you within a week or two about your referral to GI for colonoscopy. If you do not hear within 3 weeks, give us a call.   Schedule a visit with Dr. Berline Choughigby next week or later at front desk  Flu shot today  Aerobic exercise 40 minutes 4x a week also needed in addition to diet changes.   Health Maintenance Due  Topic Date Due  . HIV Screening - could consider with labs at physical 09/09/1980  . PAP SMEAR - with gynecology. Please schedule and have them send us a copy once done.  09/10/1986  . MAMMOGRAM - scheduled 05/14/17 10/28/2015   DASH Eating Plan DASH stands for "Dietary Approaches to Stop Hypertension." The DASH eating plan is a healthy eating plan that has been shown to reduce high blood pressure (hypertension). It may also reduce your risk for type 2 diabetes, heart disease, and stroke. The DASH eating plan may also help with weight loss. What are tips for following this plan? General guidelines  Avoid eating more than 2,300 mg (milligrams) of salt (sodium) a day. If you have hypertension, you may need to reduce your sodium intake to 1,500 mg a day.  Limit alcohol intake to no more than 1 drink a day for nonpregnant women and 2 drinks a day for men. One drink equals 12 oz of beer, 5 oz of wine, or 1 oz of hard liquor.  Work with your health care provider to maintain a healthy body weight or to lose weight. Ask what an ideal weight is for you.  Get at least 30 minutes of exercise that causes your heart to beat faster (aerobic exercise) most days of the week. Activities may include walking, swimming, or biking.  Work with your health care provider or diet and nutrition specialist (dietitian) to adjust your eating plan to your individual calorie needs. Reading food labels  Check food labels for the amount of sodium per serving. Choose foods with less than 5 percent of the Daily Value of sodium. Generally, foods with less than 300 mg of sodium per serving fit into this  eating plan.  To find whole grains, look for the word "whole" as the first word in the ingredient list. Shopping  Buy products labeled as "low-sodium" or "no salt added."  Buy fresh foods. Avoid canned foods and premade or frozen meals. Cooking  Avoid adding salt when cooking. Use salt-free seasonings or herbs instead of table salt or sea salt. Check with your health care provider or pharmacist before using salt substitutes.  Do not fry foods. Cook foods using healthy methods such as baking, boiling, grilling, and broiling instead.  Cook with heart-healthy oils, such as olive, canola, soybean, or sunflower oil. Meal planning   Eat a balanced diet that includes: ? 5 or more servings of fruits and vegetables each day. At each meal, try to fill half of your plate with fruits and vegetables. ? Up to 6-8 servings of whole grains each day. ? Less than 6 oz of lean meat, poultry, or fish each day. A 3-oz serving of meat is about the same size as a deck of cards. One egg equals 1 oz. ? 2 servings of low-fat dairy each day. ? A serving of nuts, seeds, or beans 5 times each week. ? Heart-healthy fats. Healthy fats called Omega-3 fatty acids are found in foods such as flaxseeds and coldwater fish, like sardines, salmon, and mackerel.  Limit how much you eat of the following: ?  Canned or prepackaged foods. ? Food that is high in trans fat, such as fried foods. ? Food that is high in saturated fat, such as fatty meat. ? Sweets, desserts, sugary drinks, and other foods with added sugar. ? Full-fat dairy products.  Do not salt foods before eating.  Try to eat at least 2 vegetarian meals each week.  Eat more home-cooked food and less restaurant, buffet, and fast food.  When eating at a restaurant, ask that your food be prepared with less salt or no salt, if possible. What foods are recommended? The items listed may not be a complete list. Talk with your dietitian about what dietary choices  are best for you. Grains Whole-grain or whole-wheat bread. Whole-grain or whole-wheat pasta. Brown rice. [place]. Bulgur. Whole-grain and low-sodium cereals. Pita bread. Low-fat, low-sodium crackers. Whole-wheat flour tortillas. Vegetables Fresh or frozen vegetables (raw, steamed, roasted, or grilled). Low-sodium or reduced-sodium tomato and vegetable juice. Low-sodium or reduced-sodium tomato sauce and tomato paste. Low-sodium or reduced-sodium canned vegetables. Fruits All fresh, dried, or frozen fruit. Canned fruit in natural juice (without added sugar). Meat and other protein foods Skinless chicken or [place]. Ground chicken or [place]. Pork with fat trimmed off. Fish and seafood. Egg whites. Dried beans, peas, or lentils. Unsalted nuts, nut butters, and seeds. Unsalted canned beans. Lean cuts of beef with fat trimmed off. Low-sodium, lean deli meat. Dairy Low-fat (1%) or fat-free (skim) milk. Fat-free, low-fat, or reduced-fat cheeses. Nonfat, low-sodium ricotta or cottage cheese. Low-fat or nonfat yogurt. Low-fat, low-sodium cheese. Fats and oils Soft margarine without trans fats. Vegetable oil. Low-fat, reduced-fat, or light mayonnaise and salad dressings (reduced-sodium). Canola, safflower, olive, soybean, and sunflower oils. Avocado. Seasoning and other foods Herbs. Spices. Seasoning mixes without salt. Unsalted popcorn and pretzels. Fat-free sweets. What foods are not recommended? The items listed may not be a complete list. Talk with your dietitian about what dietary choices are best for you. Grains Baked goods made with fat, such as croissants, muffins, or some breads. Dry pasta or rice meal packs. Vegetables Creamed or fried vegetables. Vegetables in a cheese sauce. Regular canned vegetables (not low-sodium or reduced-sodium). Regular canned tomato sauce and paste (not low-sodium or reduced-sodium). Regular tomato and vegetable juice (not low-sodium or reduced-sodium). Angie Fava.  Olives. Fruits Canned fruit in a light or heavy syrup. Fried fruit. Fruit in cream or butter sauce. Meat and other protein foods Fatty cuts of meat. Ribs. Fried meat. Berniece Salines. Sausage. Bologna and other processed lunch meats. Salami. Fatback. Hotdogs. Bratwurst. Salted nuts and seeds. Canned beans with added salt. Canned or smoked fish. Whole eggs or egg yolks. Chicken or [place] with skin. Dairy Whole or 2% milk, cream, and half-and-half. Whole or full-fat cream cheese. Whole-fat or sweetened yogurt. Full-fat cheese. Nondairy creamers. Whipped toppings. Processed cheese and cheese spreads. Fats and oils Butter. Stick margarine. Lard. Shortening. Ghee. Bacon fat. Tropical oils, such as coconut, palm kernel, or palm oil. Seasoning and other foods Salted popcorn and pretzels. Onion salt, garlic salt, seasoned salt, table salt, and sea salt. Worcestershire sauce. Tartar sauce. Barbecue sauce. Teriyaki sauce. Soy sauce, including reduced-sodium. Steak sauce. Canned and packaged gravies. Fish sauce. Oyster sauce. Cocktail sauce. Horseradish that you find on the shelf. Ketchup. Mustard. Meat flavorings and tenderizers. Bouillon cubes. Hot sauce and Tabasco sauce. Premade or packaged marinades. Premade or packaged taco seasonings. Relishes. Regular salad dressings. Where to find more information:  National Heart, Lung, and [place]: https://wilson-eaton.com/  American Heart Association: www.heart.org Summary  The DASH eating plan is a healthy eating plan that has been shown to reduce high blood pressure (hypertension). It may also reduce your risk for type 2 diabetes, heart disease, and stroke.  With the DASH eating plan, you should limit salt (sodium) intake to 2,300 mg a day. If you have hypertension, you may need to reduce your sodium intake to 1,500 mg a day.  When on the DASH eating plan, aim to eat more fresh fruits and vegetables, whole grains, lean proteins, low-fat dairy, and heart-healthy  fats.  Work with your health care provider or diet and nutrition specialist (dietitian) to adjust your eating plan to your individual calorie needs. This information is not intended to replace advice given to you by your health care provider. Make sure you discuss any questions you have with your health care provider. Document Released: 06/15/2011 Document Revised: 06/19/2016 Document Reviewed: 06/19/2016 Elsevier Interactive Patient Education  2017 [employer].

## 2017-05-01 NOTE — Assessment & Plan Note (Signed)
S: New diagnosis now with 2 readings in a row elevated. controlled poorly on no rx. Weight up 11 lbs in 2 years.   BP Readings from Last 3 Encounters:  05/01/17 (!) 148/90  10/20/14 (!) 142/92  04/21/14 118/80  A/P: We discussed blood pressure goal of <140/90. Lifestyle change/dash diet. Follow up 4 months

## 2017-05-01 NOTE — Progress Notes (Signed)
Subjective:  [name] [name] is a 51 y.o. year old very pleasant female patient who presents for/with See problem oriented charting ROS- right arm pain. No chest pain or shortness of breath. No headache or blurry vision. No neck pain.    Past Medical History-  Patient Active Problem List   Diagnosis Date Noted  . Hypertension, essential 05/01/2017  . Hyperlipidemia 10/20/2014  . Obesity 10/20/2014    Medications- reviewed and updated No current outpatient prescriptions on file.   No current facility-administered medications for this visit.     Objective: BP (!) 148/90 (BP Location: Left Arm, Patient Position: Sitting, Cuff Size: Large)   Pulse 75   Temp 99 F (37.2 C) (Oral)   Ht 5' 6.75" (1.695 m)   Wt 228 lb 6.4 oz (103.6 kg)   SpO2 97%   BMI 36.04 kg/m  Gen: NAD, resting comfortably CV: RRR no murmurs rubs or gallops Lungs: CTAB no crackles, wheeze, rhonchi Msk: see below Ext: no edema Skin: warm, dry, no rash  Assessment/Plan:  Right arm pain S: for 1 month has had issues in right forearm- hurts to try to turn wrist to look at her watch- radiates up into shoulder. No neck pain or pain with neck motion.  O: pain in forearm with pronation of lower arm A/P: Unclear cause on exam- doubt cervical given pain occurs with movement of arm- refer to sports medicine   Hyperlipidemia S: poorly controlled on no rx plus has had weight gain. No myalgias.  Lab Results  Component Value Date   CHOL 178 04/21/2014   HDL 41.40 04/21/2014   LDLCALC 118 (H) 04/21/2014   TRIG 91.0 04/21/2014   CHOLHDL 4 04/21/2014   A/P: Encouraged need for healthy eating, regular exercise, weight loss. Update lipids at [facility]  Hypertension, essential S: New diagnosis now with 2 readings in a row elevated. controlled poorly on no rx. Weight up 11 lbs in 2 years.   BP Readings from Last 3 Encounters:  05/01/17 (!) 148/90  10/20/14 (!) 142/92  04/21/14 118/80  A/P: We discussed blood pressure  goal of <140/90. Lifestyle change/dash diet. Follow up 4 months   Future Appointments Date Time Provider Department Center  05/14/2017 7:10 AM [facility] 2 [facility]  05/17/2017 8:20 AM Andrena Mewsigby, Michael D, DO [facility] None   Return in about 4 months (around 09/01/2017) for physical. See avs for HM. Flu shot given today  Orders Placed This Encounter  Procedures  . Ambulatory referral to Gastroenterology    Referral Priority:   Routine    Referral Type:   Consultation    Referral Reason:   Specialty Services Required    Number of Visits Requested:   1  . Ambulatory referral to Sports Medicine    Referral Priority:   Routine    Referral Type:   Consultation    Referred to Provider:   Andrena Mewsigby, Michael D, DO    Number of Visits Requested:   1   Return precautions advised.  Tana ConchStephen Levern Pitter, MD

## 2017-05-07 ENCOUNTER — Encounter: Payer: Self-pay | Admitting: Internal Medicine

## 2017-05-14 ENCOUNTER — Ambulatory Visit
Admission: RE | Admit: 2017-05-14 | Discharge: 2017-05-14 | Disposition: A | Discharge status: Home / Self Care | Payer: BLUE CROSS/BLUE SHIELD | Source: Ambulatory Visit | Attending: Family Medicine | Admitting: Family Medicine

## 2017-05-14 DIAGNOSIS — Z1231 Encounter for screening mammogram for malignant neoplasm of breast: Secondary | ICD-10-CM | POA: Diagnosis not present

## 2017-05-17 ENCOUNTER — Ambulatory Visit (INDEPENDENT_AMBULATORY_CARE_PROVIDER_SITE_OTHER): Payer: BLUE CROSS/BLUE SHIELD

## 2017-05-17 ENCOUNTER — Encounter: Payer: Self-pay | Admitting: Sports Medicine

## 2017-05-17 ENCOUNTER — Ambulatory Visit: Payer: BLUE CROSS/BLUE SHIELD | Admitting: Sports Medicine

## 2017-05-17 VITALS — BP 160/100 | HR 72 | Ht 66.75 in | Wt 227.0 lb

## 2017-05-17 DIAGNOSIS — M7711 Lateral epicondylitis, right elbow: Secondary | ICD-10-CM | POA: Diagnosis not present

## 2017-05-17 DIAGNOSIS — M25521 Pain in right elbow: Secondary | ICD-10-CM | POA: Diagnosis not present

## 2017-05-17 DIAGNOSIS — M79601 Pain in right arm: Secondary | ICD-10-CM

## 2017-05-17 MED ORDER — DICLOFENAC SODIUM 2 % TD SOLN: 1.0000 "application " | Freq: Two times a day (BID) | TRANSDERMAL | 2 refills | Status: DC

## 2017-05-17 MED ORDER — DICLOFENAC SODIUM 2 % TD SOLN: 1.0000 "application " | Freq: Two times a day (BID) | TRANSDERMAL | 0 refills | Status: AC

## 2017-05-17 NOTE — Progress Notes (Signed)
OFFICE VISIT NOTE Veverly FellsMichael D. Delorise Shinerigby, DO  [facility] [place] [phone]  [name] [name] - 51 y.o. female MRN [id number]  Date of birth: 04/12/1966  Visit Date: 05/17/2017  PCP: Shelva MajesticHunter, Stephen O, MD   Referred by: Shelva MajesticHunter, Stephen O, MD  Stevenson ClinchBrandy Coleman, CMA acting as scribe for Dr. Berline Choughigby.  SUBJECTIVE:   Chief Complaint  Patient presents with  . New Patient (Initial Visit)    arm pain   HPI: As below and per problem based documentation when appropriate.  [name] Lushndrea is a new patient presenting today for evaluation of RT arm pain.  Pain has been present x 1 month.  No known injury or trauma. She denies swelling, erythema, increased warmth.   The pain is described as pulling sensation and is rated as 4/10.  Worsened with rotating the wrist.  Improves with rest. Therapies tried include : She has not tried taking any OTC meds for the pain. She has not tried ice, heat, or compression.   Other associated symptoms include: pain radiates into the hand upward into the bicep.     Review of Systems  Constitutional: Negative for fever and malaise/fatigue.  Respiratory: Negative for shortness of breath and wheezing.   Cardiovascular: Negative for chest pain and palpitations.  Musculoskeletal: Positive for myalgias.  Neurological: Positive for headaches. Negative for dizziness and weakness.    Otherwise per HPI.    HISTORY & PERTINENT PRIOR DATA:  Prior History reviewed and updated per electronic medical record. Significant history, findings, studies and interim changes include: No additional findings.  reports that  has never smoked. she has never used smokeless tobacco. No results for input(s): HGBA1C, LABURIC, CREATINE in the last 8760 hours. Problem  Right Arm Pain   Three-view x-rays of the elbow: Normal      OBJECTIVE:  VS:  HT:5' 6.75" (169.5 cm)   WT:227 lb (103 kg)  BMI:35.84    BP:(!) 160/100  HR:72bpm  TEMP: ( )   RESP:98 %  PHYSICAL EXAM: Constitutional: WDWN, Non-toxic appearing. Psychiatric: Alert & appropriately interactive.Not depressed or anxious appearing. Respiratory: No increased work of breathing. Trachea Midline Eyes: Pupils are equal. EOM intact without nystagmus. No scleral icterus   UPPER EXTREMITIES No clubbing or cyanosis appreciated Capillary Refill is normal, less than 2 seconds No signficant upper extremity generalized edema Radial Pulses: Normal and symmetrically palpable Sensation in UE dermatomes: intact to light touch    Right elbow: Overall well aligned.  She has a small amount of swelling over the lateral epicondyles with focal TTP over this area.  She has pain with textbook testing and resisted wrist extension.  Grip strength is intact but painful.  Worse with ulnar deviation  ASSESSMENT & PLAN:   1. Right arm pain   2. Lateral epicondylitis of right elbow    Plan: Given the acute nature of this will start with anti-inflammatories.  Short-term follow-up and consideration of musculoskeletal ultrasound if persistent ongoing symptoms.   herapeutic exercises reviewed the importance discussed.   Tennis elbow offloading brace recommended as well OTC.  PROCEDURE NOTE: THERAPEUTIC EXERCISES (97110) 15 minutes spent for Therapeutic exercises as below and as referenced in the AVS. This included exercises focusing on stretching, strengthening, with significant focus on eccentric aspects.  Proper technique shown and discussed handout in great detail with ATC. All questions were discussed and answered.   Long term goals include an improvement in range of motion, strength, endurance as well as avoiding reinjury.  Frequency of visits is one time as determined during today's  office visit. Frequency of exercises to be performed is as per handout.  EXERCISES REVIEWED:  Eccentric wrist flexion and extension  Hammer exercises (supination and pronation eccentric)   No  problem-specific Assessment & Plan notes found for this encounter.   ++++++++++++++++++++++++++++++++++++++++++++ Orders:  Orders Placed This Encounter  Procedures  . DG ELBOW COMPLETE RIGHT (3+VIEW)    Meds:  Meds ordered this encounter  Medications  . Diclofenac Sodium (PENNSAID) 2 % SOLN    Sig: Place 1 application 2 (two) times daily onto the skin.    Dispense:  112 g    Refill:  2    Home Phone      [phone] Mobile          [phone]   . Diclofenac Sodium (PENNSAID) 2 % SOLN    Sig: Place 1 application 2 (two) times daily for 1 day onto the skin.    Dispense:  8 g    Refill:  0     ++++++++++++++++++++++++++++++++++++++++++++ Follow-up: Return in about 4 weeks (around 06/14/2017).   Pertinent documentation may be included in additional procedure notes, imaging studies, problem based documentation and patient instructions. Please see these sections of the encounter for additional information regarding this visit. CMA/ATC served as Neurosurgeonscribe during this visit. History, Physical, and Plan performed by medical provider. Documentation and orders reviewed and attested to.      Andrena MewsMichael D Legend Pecore, DO    [facility]

## 2017-05-17 NOTE — Patient Instructions (Signed)
Please perform the exercise program that we have prepared for you and gone over in detail on a daily basis.  In addition to the handout you were provided you can access your program through: [email or web address]   Your unique program code is: [email or web address]

## 2017-06-08 ENCOUNTER — Encounter: Payer: Self-pay | Admitting: Sports Medicine

## 2017-06-08 DIAGNOSIS — M79601 Pain in right arm: Secondary | ICD-10-CM | POA: Insufficient documentation

## 2017-06-14 ENCOUNTER — Ambulatory Visit: Payer: BLUE CROSS/BLUE SHIELD | Admitting: Sports Medicine

## 2017-07-05 ENCOUNTER — Encounter: Payer: Self-pay | Admitting: Obstetrics & Gynecology

## 2017-07-05 ENCOUNTER — Ambulatory Visit (INDEPENDENT_AMBULATORY_CARE_PROVIDER_SITE_OTHER): Payer: BLUE CROSS/BLUE SHIELD | Admitting: Obstetrics & Gynecology

## 2017-07-05 VITALS — BP 154/100 | Ht 64.5 in | Wt 228.0 lb

## 2017-07-05 DIAGNOSIS — E6609 Other obesity due to excess calories: Secondary | ICD-10-CM

## 2017-07-05 DIAGNOSIS — Z90710 Acquired absence of both cervix and uterus: Secondary | ICD-10-CM | POA: Diagnosis not present

## 2017-07-05 DIAGNOSIS — Z1151 Encounter for screening for human papillomavirus (HPV): Secondary | ICD-10-CM

## 2017-07-05 DIAGNOSIS — Z01411 Encounter for gynecological examination (general) (routine) with abnormal findings: Secondary | ICD-10-CM | POA: Diagnosis not present

## 2017-07-05 DIAGNOSIS — Z6838 Body mass index (BMI) 38.0-38.9, adult: Secondary | ICD-10-CM | POA: Diagnosis not present

## 2017-07-05 NOTE — Progress Notes (Signed)
[name] [name] 01/27/1966 [id number]   History:    51 y.o. G4P2A2L2  Married x 11 months  RP:  New patient presenting for annual gyn exam   HPI:  [facility].  S/P Hysterectomy.  No pelvic pain.  No pain with IC.  No menopausal Sx.  Breasts wnl.  Urine/BMs wnl.  Obesity, BMI 38.53.  Difficulty loosing weight.  HTN followed by Dr Durene CalHunter, f/u scheduled next month.  Health labs with Dr Durene CalHunter.  Colonoscopy scheduled in 1 month.  Past medical history,surgical history, family history and social history were all reviewed and documented in the EPIC chart.  Gynecologic History No LMP recorded. Patient has had a hysterectomy. Contraception: status post hysterectomy Last Pap: 5 years ago. Results were: normal Last mammogram: 2018. Results were: normal Colono scheduled next month  Obstetric History OB History  Gravida Para Term Preterm AB Living  4 2     2 2   SAB TAB Ectopic Multiple Live Births               # Outcome Date GA Lbr Len/2nd Weight Sex Delivery Anes PTL Lv  4 AB           3 AB           2 Para           1 Para                ROS: A ROS was performed and pertinent positives and negatives are included in the history.  GENERAL: No fevers or chills. HEENT: No change in vision, no earache, sore throat or sinus congestion. NECK: No pain or stiffness. CARDIOVASCULAR: No chest pain or pressure. No palpitations. PULMONARY: No shortness of breath, cough or wheeze. GASTROINTESTINAL: No abdominal pain, nausea, vomiting or diarrhea, melena or bright red blood per rectum. GENITOURINARY: No urinary frequency, urgency, hesitancy or dysuria. MUSCULOSKELETAL: No joint or muscle pain, no back pain, no recent trauma. DERMATOLOGIC: No rash, no itching, no lesions. ENDOCRINE: No polyuria, polydipsia, no heat or cold intolerance. No recent change in weight. HEMATOLOGICAL: No anemia or easy bruising or bleeding. NEUROLOGIC: No  headache, seizures, numbness, tingling or weakness. PSYCHIATRIC: No depression, no loss of interest in normal activity or change in sleep pattern.     Exam:   BP (!) 154/100   Ht 5' 4.5" (1.638 m)   Wt 228 lb (103.4 kg)   BMI 38.53 kg/m   Body mass index is 38.53 kg/m.  General appearance : Well developed well nourished female. No acute distress HEENT: Eyes: no retinal hemorrhage or exudates,  Neck supple, trachea midline, no carotid bruits, no thyroidmegaly Lungs: Clear to auscultation, no rhonchi or wheezes, or rib retractions  Heart: Regular rate and rhythm, no murmurs or gallops Breast:Examined in sitting and supine position were symmetrical in appearance, no palpable masses or tenderness,  no skin retraction, no nipple inversion, no nipple discharge, no skin discoloration, no axillary or supraclavicular lymphadenopathy Abdomen: no palpable masses or tenderness, no rebound or guarding Extremities: no edema or skin discoloration or tenderness  Pelvic: Vulva normal  Bartholin, Urethra, Skene Glands: Within normal limits             Vagina: No gross lesions or discharge.  Pap reflex done.  Cervix/Uterus absent  Adnexa  Without masses or tenderness  Anus and perineum  normal     Assessment/Plan:  51 y.o. female  for annual exam   1. Encounter for gynecological examination with abnormal finding Gynecologic exam status post hysterectomy.  Pap reflex done on the vaginal vault.  Breast exam normal.  Mammogram normal in 2018.  Colonoscopy scheduled next month.  Health labs with family physician.  2. Hx of total hysterectomy   3. Class 2 obesity due to excess calories without serious comorbidity with body mass index (BMI) of 38.0 to 38.9 in adult Low calorie/low sugar diet discussed.  [place] [place] diet recommended.  Regular aerobic activities 5 times a week and weightlifting every 2 days recommended.  Will follow up for weight management as needed.   Genia DelMarie-Lyne Soffia Doshier MD, 9:56 AM  07/05/2017

## 2017-07-07 ENCOUNTER — Encounter: Payer: Self-pay | Admitting: Obstetrics & Gynecology

## 2017-07-07 NOTE — Patient Instructions (Signed)
1. Encounter for gynecological examination with abnormal finding Gynecologic exam status post hysterectomy.  Pap reflex done on the vaginal vault.  Breast exam normal.  Mammogram normal in 2018.  Colonoscopy scheduled next month.  Health labs with family physician.  2. Hx of total hysterectomy   3. Class 2 obesity due to excess calories without serious comorbidity with body mass index (BMI) of 38.0 to 38.9 in adult Low calorie/low sugar diet discussed.  [employer] recommended.  Regular aerobic activities 5 times a week and weightlifting every 2 days recommended.  Will follow up for weight management as needed.  [name] [name], it was a pleasure seeing you today!  I will inform you of your results as soon as they are available.   Exercising to Lose Weight Exercising can help you to lose weight. In order to lose weight through exercise, you need to do vigorous-intensity exercise. You can tell that you are exercising with vigorous intensity if you are breathing very hard and fast and cannot hold a conversation while exercising. Moderate-intensity exercise helps to maintain your current weight. You can tell that you are exercising at a moderate level if you have a higher heart rate and faster breathing, but you are still able to hold a conversation. How often should I exercise? Choose an activity that you enjoy and set realistic goals. Your health care provider can help you to make an activity plan that works for you. Exercise regularly as directed by your health care provider. This may include:  Doing resistance training twice each week, such as: ? Push-ups. ? Sit-ups. ? Lifting weights. ? Using resistance bands.  Doing a given intensity of exercise for a given amount of time. Choose from these options: ? 150 minutes of moderate-intensity exercise every week. ? 75 minutes of vigorous-intensity exercise every week. ? A mix of moderate-intensity and vigorous-intensity exercise every  week.  Children, pregnant women, people who are out of shape, people who are overweight, and older adults may need to consult a health care provider for individual recommendations. If you have any sort of medical condition, be sure to consult your health care provider before starting a new exercise program. What are some activities that can help me to lose weight?  Walking at a rate of at least 4.5 miles an hour.  Jogging or running at a rate of 5 miles per hour.  Biking at a rate of at least 10 miles per hour.  Lap swimming.  Roller-skating or in-line skating.  Cross-country skiing.  Vigorous competitive sports, such as football, basketball, and soccer.  Jumping rope.  Aerobic dancing. How can I be more active in my day-to-day activities?  Use the stairs instead of the elevator.  Take a walk during your lunch break.  If you drive, park your car farther away from work or school.  If you take public transportation, get off one stop early and walk the rest of the way.  Make all of your phone calls while standing up and walking around.  Get up, stretch, and walk around every 30 minutes throughout the day. What guidelines should I follow while exercising?  Do not exercise so much that you hurt yourself, feel dizzy, or get very short of breath.  Consult your health care provider prior to starting a new exercise program.  Wear comfortable clothes and shoes with good support.  Drink plenty of water while you exercise to prevent dehydration or heat stroke. Body water is lost during exercise and must be  replaced.  Work out until you breathe faster and your heart beats faster. This information is not intended to replace advice given to you by your health care provider. Make sure you discuss any questions you have with your health care provider. Document Released: 07/29/2010 Document Revised: 12/02/2015 Document Reviewed: 11/27/2013 Elsevier Interactive Patient Education  2018  [place] [employer] Calories are units of energy. Your body needs a certain amount of calories from food to keep you going throughout the day. When you eat more calories than your body needs, your body stores the extra calories as fat. When you eat fewer calories than your body needs, your body burns fat to get the energy it needs. Calorie counting means keeping track of how many calories you eat and drink each day. Calorie counting can be helpful if you need to lose weight. If you make sure to eat fewer calories than your body needs, you should lose weight. Ask your health care provider what a healthy weight is for you. For calorie counting to work, you will need to eat the right number of calories in a day in order to lose a healthy amount of weight per week. A dietitian can help you determine how many calories you need in a day and will give you suggestions on how to reach your calorie goal.  A healthy amount of weight to lose per week is usually 1-2 lb (0.5-0.9 kg). This usually means that your daily calorie intake should be reduced by 500-750 calories.  Eating 1,200 - 1,500 calories per day can help most women lose weight.  Eating 1,500 - 1,800 calories per day can help most men lose weight.  What is my plan? My goal is to have __________ calories per day. If I have this many calories per day, I should lose around __________ pounds per week. What do I need to know about calorie counting? In order to meet your daily calorie goal, you will need to:  Find out how many calories are in each food you would like to eat. Try to do this before you eat.  Decide how much of the food you plan to eat.  Write down what you ate and how many calories it had. Doing this is called keeping a food log.  To successfully lose weight, it is important to balance calorie counting with a healthy lifestyle that includes regular activity. Aim for 150 minutes of moderate exercise (such  as walking) or 75 minutes of vigorous exercise (such as running) each week. Where do I find calorie information?  The number of calories in a food can be found on a Nutrition Facts label. If a food does not have a Nutrition Facts label, try to look up the calories online or ask your dietitian for help. Remember that calories are listed per serving. If you choose to have more than one serving of a food, you will have to multiply the calories per serving by the amount of servings you plan to eat. For example, the label on a package of bread might say that a serving size is 1 slice and that there are 90 calories in a serving. If you eat 1 slice, you will have eaten 90 calories. If you eat 2 slices, you will have eaten 180 calories. How do I keep a food log? Immediately after each meal, record the following information in your food log:  What you ate. Don't forget to include toppings, sauces, and other extras  on the food.  How much you ate. This can be measured in cups, ounces, or number of items.  How many calories each food and drink had.  The total number of calories in the meal.  Keep your food log near you, such as in a small notebook in your pocket, or use a mobile app or website. Some programs will calculate calories for you and show you how many calories you have left for the day to meet your goal. What are some calorie counting tips?  Use your calories on foods and drinks that will fill you up and not leave you hungry: ? Some examples of foods that fill you up are nuts and nut butters, vegetables, lean proteins, and high-fiber foods like whole grains. High-fiber foods are foods with more than 5 g fiber per serving. ? Drinks such as sodas, specialty coffee drinks, alcohol, and juices have a lot of calories, yet do not fill you up.  Eat nutritious foods and avoid empty calories. Empty calories are calories you get from foods or beverages that do not have many vitamins or protein, such as  candy, sweets, and soda. It is better to have a nutritious high-calorie food (such as an avocado) than a food with few nutrients (such as a bag of chips).  Know how many calories are in the foods you eat most often. This will help you calculate calorie counts faster.  Pay attention to calories in drinks. Low-calorie drinks include water and unsweetened drinks.  Pay attention to nutrition labels for "low fat" or "fat free" foods. These foods sometimes have the same amount of calories or more calories than the full fat versions. They also often have added sugar, starch, or salt, to make up for flavor that was removed with the fat.  Find a way of tracking calories that works for you. Get creative. Try different apps or programs if writing down calories does not work for you. What are some portion control tips?  Know how many calories are in a serving. This will help you know how many servings of a certain food you can have.  Use a measuring cup to measure serving sizes. You could also try weighing out portions on a kitchen scale. With time, you will be able to estimate serving sizes for some foods.  Take some time to put servings of different foods on your favorite plates, bowls, and cups so you know what a serving looks like.  Try not to eat straight from a bag or box. Doing this can lead to overeating. Put the amount you would like to eat in a cup or on a plate to make sure you are eating the right portion.  Use smaller plates, glasses, and bowls to prevent overeating.  Try not to multitask (for example, watch TV or use your computer) while eating. If it is time to eat, sit down at a table and enjoy your food. This will help you to know when you are full. It will also help you to be aware of what you are eating and how much you are eating. What are tips for following this plan? Reading food labels  Check the calorie count compared to the serving size. The serving size may be smaller than what  you are used to eating.  Check the source of the calories. Make sure the food you are eating is high in vitamins and protein and low in saturated and trans fats. Shopping  Read nutrition labels while you  shop. This will help you make healthy decisions before you decide to purchase your food.  Make a grocery list and stick to it. Cooking  Try to cook your favorite foods in a healthier way. For example, try baking instead of frying.  Use low-fat dairy products. Meal planning  Use more fruits and vegetables. Half of your plate should be fruits and vegetables.  Include lean proteins like poultry and fish. How do I count calories when eating out?  Ask for smaller portion sizes.  Consider sharing an entree and sides instead of getting your own entree.  If you get your own entree, eat only half. Ask for a box at the beginning of your meal and put the rest of your entree in it so you are not tempted to eat it.  If calories are listed on the menu, choose the lower calorie options.  Choose dishes that include vegetables, fruits, whole grains, low-fat dairy products, and lean protein.  Choose items that are boiled, broiled, grilled, or steamed. Stay away from items that are buttered, battered, fried, or served with cream sauce. Items labeled "crispy" are usually fried, unless stated otherwise.  Choose water, low-fat milk, unsweetened iced tea, or other drinks without added sugar. If you want an alcoholic beverage, choose a lower calorie option such as a glass of wine or light beer.  Ask for dressings, sauces, and syrups on the side. These are usually high in calories, so you should limit the amount you eat.  If you want a salad, choose a garden salad and ask for grilled meats. Avoid extra toppings like bacon, cheese, or fried items. Ask for the dressing on the side, or ask for olive oil and vinegar or lemon to use as dressing.  Estimate how many servings of a food you are given. For  example, a serving of cooked rice is  cup or about the size of half a baseball. Knowing serving sizes will help you be aware of how much food you are eating at restaurants. The list below tells you how big or small some common portion sizes are based on everyday objects: ? 1 oz-4 stacked dice. ? 3 oz-1 deck of cards. ? 1 tsp-1 die. ? 1 Tbsp- a ping-pong ball. ? 2 Tbsp-1 ping-pong ball. ?  cup- baseball. ? 1 cup-1 baseball. Summary  Calorie counting means keeping track of how many calories you eat and drink each day. If you eat fewer calories than your body needs, you should lose weight.  A healthy amount of weight to lose per week is usually 1-2 lb (0.5-0.9 kg). This usually means reducing your daily calorie intake by 500-750 calories.  The number of calories in a food can be found on a Nutrition Facts label. If a food does not have a Nutrition Facts label, try to look up the calories online or ask your dietitian for help.  Use your calories on foods and drinks that will fill you up, and not on foods and drinks that will leave you hungry.  Use smaller plates, glasses, and bowls to prevent overeating. This information is not intended to replace advice given to you by your health care provider. Make sure you discuss any questions you have with your health care provider. Document Released: 06/26/2005 Document Revised: 05/26/2016 Document Reviewed: 05/26/2016 Elsevier Interactive Patient Education  2018 [place] Maintenance, Female Adopting a healthy lifestyle and getting preventive care can go a long way to promote health and wellness. Talk with your health  care provider about what schedule of regular examinations is right for you. This is a good chance for you to check in with your provider about disease prevention and staying healthy. In between checkups, there are plenty of things you can do on your own. Experts have done a lot of research about which lifestyle changes and  preventive measures are most likely to keep you healthy. Ask your health care provider for more information. Weight and diet Eat a healthy diet  Be sure to include plenty of vegetables, fruits, low-fat dairy products, and lean protein.  Do not eat a lot of foods high in solid fats, added sugars, or salt.  Get regular exercise. This is one of the most important things you can do for your health. ? Most adults should exercise for at least 150 minutes each week. The exercise should increase your heart rate and make you sweat (moderate-intensity exercise). ? Most adults should also do strengthening exercises at least twice a week. This is in addition to the moderate-intensity exercise.  Maintain a healthy weight  Body mass index (BMI) is a measurement that can be used to identify possible weight problems. It estimates body fat based on height and weight. Your health care provider can help determine your BMI and help you achieve or maintain a healthy weight.  For females 42 years of age and older: ? A BMI below 18.5 is considered underweight. ? A BMI of 18.5 to 24.9 is normal. ? A BMI of 25 to 29.9 is considered overweight. ? A BMI of 30 and above is considered obese.  Watch levels of cholesterol and blood lipids  You should start having your blood tested for lipids and cholesterol at 51 years of age, then have this test every 5 years.  You may need to have your cholesterol levels checked more often if: ? Your lipid or cholesterol levels are high. ? You are older than 51 years of age. ? You are at high risk for heart disease.  Cancer screening Lung Cancer  Lung cancer screening is recommended for adults 76-91 years old who are at high risk for lung cancer because of a history of smoking.  A yearly low-dose CT scan of the lungs is recommended for people who: ? Currently smoke. ? Have quit within the past 15 years. ? Have at least a 30-pack-year history of smoking. A pack year is  smoking an average of one pack of cigarettes a day for 1 year.  Yearly screening should continue until it has been 15 years since you quit.  Yearly screening should stop if you develop a health problem that would prevent you from having lung cancer treatment.  Breast Cancer  Practice breast self-awareness. This means understanding how your breasts normally appear and feel.  It also means doing regular breast self-exams. Let your health care provider know about any changes, no matter how small.  If you are in your 20s or 30s, you should have a clinical breast exam (CBE) by a health care provider every 1-3 years as part of a regular health exam.  If you are 15 or older, have a CBE every year. Also consider having a breast X-ray (mammogram) every year.  If you have a family history of breast cancer, talk to your health care provider about genetic screening.  If you are at high risk for breast cancer, talk to your health care provider about having an MRI and a mammogram every year.  Breast cancer gene (BRCA) assessment  is recommended for women who have family members with BRCA-related cancers. BRCA-related cancers include: ? Breast. ? Ovarian. ? Tubal. ? Peritoneal cancers.  Results of the assessment will determine the need for genetic counseling and BRCA1 and BRCA2 testing.  Cervical Cancer Your health care provider may recommend that you be screened regularly for cancer of the pelvic organs (ovaries, uterus, and vagina). This screening involves a pelvic examination, including checking for microscopic changes to the surface of your cervix (Pap test). You may be encouraged to have this screening done every 3 years, beginning at age 89.  For women ages 78-65, health care providers may recommend pelvic exams and Pap testing every 3 years, or they may recommend the Pap and pelvic exam, combined with testing for human papilloma virus (HPV), every 5 years. Some types of HPV increase your risk  of cervical cancer. Testing for HPV may also be done on women of any age with unclear Pap test results.  Other health care providers may not recommend any screening for nonpregnant women who are considered low risk for pelvic cancer and who do not have symptoms. Ask your health care provider if a screening pelvic exam is right for you.  If you have had past treatment for cervical cancer or a condition that could lead to cancer, you need Pap tests and screening for cancer for at least 20 years after your treatment. If Pap tests have been discontinued, your risk factors (such as having a new sexual partner) need to be reassessed to determine if screening should resume. Some women have medical problems that increase the chance of getting cervical cancer. In these cases, your health care provider may recommend more frequent screening and Pap tests.  Colorectal Cancer  This type of cancer can be detected and often prevented.  Routine colorectal cancer screening usually begins at 51 years of age and continues through 51 years of age.  Your health care provider may recommend screening at an earlier age if you have risk factors for colon cancer.  Your health care provider may also recommend using home test kits to check for hidden blood in the stool.  A small camera at the end of a tube can be used to examine your colon directly (sigmoidoscopy or colonoscopy). This is done to check for the earliest forms of colorectal cancer.  Routine screening usually begins at age 96.  Direct examination of the colon should be repeated every 5-10 years through 51 years of age. However, you may need to be screened more often if early forms of precancerous polyps or small growths are found.  Skin Cancer  Check your skin from head to toe regularly.  Tell your health care provider about any new moles or changes in moles, especially if there is a change in a mole's shape or color.  Also tell your health care  provider if you have a mole that is larger than the size of a pencil eraser.  Always use sunscreen. Apply sunscreen liberally and repeatedly throughout the day.  Protect yourself by wearing long sleeves, pants, a wide-brimmed hat, and sunglasses whenever you are outside.  Heart disease, diabetes, and high blood pressure  High blood pressure causes heart disease and increases the risk of stroke. High blood pressure is more likely to develop in: ? People who have blood pressure in the high end of the normal range (130-139/85-89 mm Hg). ? People who are overweight or obese. ? People who are African American.  If you are 18-39  years of age, have your blood pressure checked every 3-5 years. If you are 22 years of age or older, have your blood pressure checked every year. You should have your blood pressure measured twice-once when you are at a hospital or clinic, and once when you are not at a hospital or clinic. Record the average of the two measurements. To check your blood pressure when you are not at a hospital or clinic, you can use: ? An automated blood pressure machine at a pharmacy. ? A home blood pressure monitor.  If you are between 74 years and 87 years old, ask your health care provider if you should take aspirin to prevent strokes.  Have regular diabetes screenings. This involves taking a blood sample to check your fasting blood sugar level. ? If you are at a normal weight and have a low risk for diabetes, have this test once every three years after 51 years of age. ? If you are overweight and have a high risk for diabetes, consider being tested at a younger age or more often. Preventing infection Hepatitis B  If you have a higher risk for hepatitis B, you should be screened for this virus. You are considered at high risk for hepatitis B if: ? You were born in a country where hepatitis B is common. Ask your health care provider which countries are considered high risk. ? Your  parents were born in a high-risk country, and you have not been immunized against hepatitis B (hepatitis B vaccine). ? You have HIV or AIDS. ? You use needles to inject street drugs. ? You live with someone who has hepatitis B. ? You have had sex with someone who has hepatitis B. ? You get hemodialysis treatment. ? You take certain medicines for conditions, including cancer, organ transplantation, and autoimmune conditions.  Hepatitis C  Blood testing is recommended for: ? Everyone born from [birth year] through 1965. ? Anyone with known risk factors for hepatitis C.  Sexually transmitted infections (STIs)  You should be screened for sexually transmitted infections (STIs) including gonorrhea and chlamydia if: ? You are sexually active and are younger than 51 years of age. ? You are older than 51 years of age and your health care provider tells you that you are at risk for this type of infection. ? Your sexual activity has changed since you were last screened and you are at an increased risk for chlamydia or gonorrhea. Ask your health care provider if you are at risk.  If you do not have HIV, but are at risk, it may be recommended that you take a prescription medicine daily to prevent HIV infection. This is called pre-exposure prophylaxis (PrEP). You are considered at risk if: ? You are sexually active and do not regularly use condoms or know the HIV status of your partner(s). ? You take drugs by injection. ? You are sexually active with a partner who has HIV.  Talk with your health care provider about whether you are at high risk of being infected with HIV. If you choose to begin PrEP, you should first be tested for HIV. You should then be tested every 3 months for as long as you are taking PrEP. Pregnancy  If you are premenopausal and you may become pregnant, ask your health care provider about preconception counseling.  If you may become pregnant, take 400 to 800 micrograms (mcg) of folic  acid every day.  If you want to prevent pregnancy, talk to your health care  provider about birth control (contraception). Osteoporosis and menopause  Osteoporosis is a disease in which the bones lose minerals and strength with aging. This can result in serious bone fractures. Your risk for osteoporosis can be identified using a bone density scan.  If you are 2 years of age or older, or if you are at risk for osteoporosis and fractures, ask your health care provider if you should be screened.  Ask your health care provider whether you should take a calcium or vitamin D supplement to lower your risk for osteoporosis.  Menopause may have certain physical symptoms and risks.  Hormone replacement therapy may reduce some of these symptoms and risks. Talk to your health care provider about whether hormone replacement therapy is right for you. Follow these instructions at home:  Schedule regular health, dental, and eye exams.  Stay current with your immunizations.  Do not use any tobacco products including cigarettes, chewing tobacco, or electronic cigarettes.  If you are pregnant, do not drink alcohol.  If you are breastfeeding, limit how much and how often you drink alcohol.  Limit alcohol intake to no more than 1 drink per day for nonpregnant women. One drink equals 12 ounces of beer, 5 ounces of wine, or 1 ounces of hard liquor.  Do not use street drugs.  Do not share needles.  Ask your health care provider for help if you need support or information about quitting drugs.  Tell your health care provider if you often feel depressed.  Tell your health care provider if you have ever been abused or do not feel safe at home. This information is not intended to replace advice given to you by your health care provider. Make sure you discuss any questions you have with your health care provider. Document Released: 01/09/2011 Document Revised: 12/02/2015 Document Reviewed:  03/30/2015 Elsevier Interactive Patient Education  [employer].

## 2017-07-09 ENCOUNTER — Other Ambulatory Visit: Payer: Self-pay

## 2017-07-09 ENCOUNTER — Ambulatory Visit (AMBULATORY_SURGERY_CENTER): Payer: Self-pay | Admitting: *Deleted

## 2017-07-09 VITALS — Ht 67.0 in | Wt 227.0 lb

## 2017-07-09 DIAGNOSIS — Z1211 Encounter for screening for malignant neoplasm of colon: Secondary | ICD-10-CM

## 2017-07-09 LAB — PAP IG W/ RFLX HPV ASCU

## 2017-07-09 MED ORDER — NA SULFATE-K SULFATE-MG SULF 17.5-3.13-1.6 GM/177ML PO SOLN: 1.0000 [IU] | Freq: Once | ORAL | 0 refills | Status: AC

## 2017-07-09 NOTE — Progress Notes (Signed)
No egg or soy allergy known to patient  No issues with past sedation with any surgeries  or procedures, no intubation problems  No diet pills per patient No home 02 use per patient  No blood thinners per patient  Pt denies issues with constipation  No A fib or A flutter  EMMI video sent to pt's e mail  

## 2017-07-17 ENCOUNTER — Encounter: Payer: Self-pay | Admitting: Internal Medicine

## 2017-07-23 ENCOUNTER — Other Ambulatory Visit: Payer: Self-pay

## 2017-07-23 ENCOUNTER — Ambulatory Visit (AMBULATORY_SURGERY_CENTER): Payer: BLUE CROSS/BLUE SHIELD | Admitting: Internal Medicine

## 2017-07-23 ENCOUNTER — Encounter: Payer: Self-pay | Admitting: Internal Medicine

## 2017-07-23 VITALS — BP 132/81 | HR 68 | Temp 96.0°F | Resp 12 | Ht 64.5 in | Wt 228.0 lb

## 2017-07-23 DIAGNOSIS — Z1212 Encounter for screening for malignant neoplasm of rectum: Secondary | ICD-10-CM

## 2017-07-23 DIAGNOSIS — Z1211 Encounter for screening for malignant neoplasm of colon: Secondary | ICD-10-CM

## 2017-07-23 MED ORDER — SODIUM CHLORIDE 0.9 % IV SOLN: 500.0000 mL | INTRAVENOUS | Status: DC

## 2017-07-23 NOTE — Op Note (Signed)
[facility] Patient Name: [name] [name] Procedure Date: 07/23/2017 10:51 AM MRN: [id number] Endoscopist: Beverley FiedlerJay M Carmelina Balducci , MD Age: 52 Referring MD:  Date of Birth: 01/26/1966 Gender: Female Account #: [id number] Procedure:                Colonoscopy Indications:              Screening for colorectal malignant neoplasm, This                            is the patient's first colonoscopy Medicines:                Monitored Anesthesia Care Procedure:                Pre-Anesthesia Assessment:                           - Prior to the procedure, a History and Physical                            was performed, and patient medications and                            allergies were reviewed. The patient's tolerance of                            previous anesthesia was also reviewed. The risks                            and benefits of the procedure and the sedation                            options and risks were discussed with the patient.                            All questions were answered, and informed consent                            was obtained. Prior Anticoagulants: The patient has                            taken no previous anticoagulant or antiplatelet                            agents. ASA Grade Assessment: II - A patient with                            mild systemic disease. After reviewing the risks                            and benefits, the patient was deemed in                            satisfactory condition to undergo the procedure.  After obtaining informed consent, the colonoscope                            was passed under direct vision. Throughout the                            procedure, the patient's blood pressure, pulse, and                            oxygen saturations were monitored continuously. The                            Model PCF-[id number]) scope was introduced                            through the anus and  advanced to the the cecum,                            identified by appendiceal orifice and ileocecal                            valve. The colonoscopy was performed without                            difficulty. The patient tolerated the procedure                            well. The quality of the bowel preparation was                            good. The ileocecal valve, appendiceal orifice, and                            rectum were photographed. Scope In: 11:01:09 AM Scope Out: 11:14:44 AM Scope Withdrawal Time: 0 hours 10 minutes 57 seconds  Total Procedure Duration: 0 hours 13 minutes 35 seconds  Findings:                 The digital rectal exam was normal.                           Scattered small-mouthed diverticula were found in                            the sigmoid colon, descending colon and splenic                            flexure.                           External hemorrhoids were found during retroflexion                            and during digital exam. The hemorrhoids were small.  The exam was otherwise without abnormality. Complications:            No immediate complications. Estimated Blood Loss:     Estimated blood loss: none. Impression:               - Mild diverticulosis in the sigmoid colon, in the                            descending colon and at the splenic flexure.                           - External hemorrhoids.                           - The examination was otherwise normal.                           - No specimens collected. Recommendation:           - Patient has a contact number available for                            emergencies. The signs and symptoms of potential                            delayed complications were discussed with the                            patient. Return to normal activities tomorrow.                            Written discharge instructions were provided to the                             patient.                           - Resume previous diet.                           - Continue present medications.                           - Repeat colonoscopy in 10 years for screening                            purposes. Beverley Fiedler, MD 07/23/2017 11:19:42 AM This report has been signed electronically.

## 2017-07-23 NOTE — Progress Notes (Signed)
Pt. Reports no change in her medical or surgical history since her pre-visit 07/09/2017.

## 2017-07-23 NOTE — Progress Notes (Signed)
Report to PACU, RN, vss, BBS= Clear.  

## 2017-07-23 NOTE — Patient Instructions (Signed)
   INFORMATION GIVEN ON DIVERTICULOSIS AND HEMORRHOIDS  YOU HAD AN ENDOSCOPIC PROCEDURE TODAY AT THE [facility]:   Refer to the procedure report that was given to you for any specific questions about what was found during the examination.  If the procedure report does not answer your questions, please call your gastroenterologist to clarify.  If you requested that your care partner not be given the details of your procedure findings, then the procedure report has been included in a sealed envelope for you to review at your convenience later.  YOU SHOULD EXPECT: Some feelings of bloating in the abdomen. Passage of more gas than usual.  Walking can help get rid of the air that was put into your GI tract during the procedure and reduce the bloating. If you had a lower endoscopy (such as a colonoscopy or flexible sigmoidoscopy) you may notice spotting of blood in your stool or on the toilet paper. If you underwent a bowel prep for your procedure, you may not have a normal bowel movement for a few days.  Please Note:  You might notice some irritation and congestion in your nose or some drainage.  This is from the oxygen used during your procedure.  There is no need for concern and it should clear up in a day or so.  SYMPTOMS TO REPORT IMMEDIATELY:   Following lower endoscopy (colonoscopy or flexible sigmoidoscopy):  Excessive amounts of blood in the stool  Significant tenderness or worsening of abdominal pains  Swelling of the abdomen that is new, acute  Fever of 100F or higher  For urgent or emergent issues, a gastroenterologist can be reached at any hour by calling [phone].   DIET:  We do recommend a small meal at first, but then you may proceed to your regular diet.  Drink plenty of fluids but you should avoid alcoholic beverages for 24 hours.  ACTIVITY:  You should plan to take it easy for the rest of today and you should NOT DRIVE or use heavy machinery until tomorrow  (because of the sedation medicines used during the test).    FOLLOW UP: Our staff will call the number listed on your records the next business day following your procedure to check on you and address any questions or concerns that you may have regarding the information given to you following your procedure. If we do not reach you, we will leave a message.  However, if you are feeling well and you are not experiencing any problems, there is no need to return our call.  We will assume that you have returned to your regular daily activities without incident.  If any biopsies were taken you will be contacted by phone or by letter within the next 1-3 weeks.  Please call us at [phone] if you have not heard about the biopsies in 3 weeks.    SIGNATURES/CONFIDENTIALITY: You and/or your care partner have signed paperwork which will be entered into your electronic medical record.  These signatures attest to the fact that that the information above on your After Visit Summary has been reviewed and is understood.  Full responsibility of the confidentiality of this discharge information lies with you and/or your care-partner.

## 2017-07-24 ENCOUNTER — Telehealth: Payer: Self-pay

## 2017-07-24 NOTE — Telephone Encounter (Signed)
  Follow up Call-  Call back number 07/23/2017  Post procedure Call Back phone  # [phone]  Permission to leave phone message No  Some recent data might be hidden     Patient questions:  Do you have a fever, pain , or abdominal swelling? No. Pain Score  0 *  Have you tolerated food without any problems? Yes.    Have you been able to return to your normal activities? Yes.    Do you have any questions about your discharge instructions: Diet   No. Medications  No. Follow up visit  No.  Do you have questions or concerns about your Care? No.  Actions: * If pain score is 4 or above: No action needed, pain <4.  No problems noted per pt. maw

## 2017-08-30 ENCOUNTER — Ambulatory Visit: Payer: BLUE CROSS/BLUE SHIELD | Admitting: Obstetrics & Gynecology

## 2017-08-30 ENCOUNTER — Encounter: Payer: Self-pay | Admitting: Obstetrics & Gynecology

## 2017-08-30 VITALS — BP 162/98 | Temp 99.4°F

## 2017-08-30 DIAGNOSIS — L720 Epidermal cyst: Secondary | ICD-10-CM | POA: Diagnosis not present

## 2017-08-30 NOTE — Progress Notes (Signed)
    [name] [name] 04/01/1966 [id number]        52 y.o.  [id number]   RP: Increased discomfort/pain associated with a bump below th Rt breast  HPI: Had a small bump under the Rt breast at 07/05/2017 visit, an epidermal inclusion cyst, which is now slightly bigger, more tender and causing discomfort with her braw.  Some itching.  No drainage.  No fever.   OB History  Gravida Para Term Preterm AB Living  4 2     2 2   SAB TAB Ectopic Multiple Live Births               # Outcome Date GA Lbr Len/2nd Weight Sex Delivery Anes PTL Lv  4 AB           3 AB           2 Para           1 Para               Past medical history,surgical history, problem list, medications, allergies, family history and social history were all reviewed and documented in the EPIC chart.   Directed ROS with pertinent positives and negatives documented in the history of present illness/assessment and plan.  Exam:  Vitals:   08/30/17 1026  BP: (!) 162/98  Temp: 99.4 F (37.4 C)  TempSrc: Oral   General appearance:  Normal  Breasts:  Inclusion cyst below the right breast on the inner side.  Inclusion cyst 1 cm in diameter.  Drained at patient's request:  Verbal informed consent obtained.  Betadine prep.  Small opening created with a 20 gauge needle.  Thick whitish material drained with pressure on either side.  Completely emptied.  Pressure applied and covered with a folded 4x4 gauze taped.     Assessment/Plan:  52 y.o. [id number]   1. Epidermal inclusion cyst Epidermal inclusion cyst below the inner Rt breast causing discomfort.  Easily incised and drained.  Keep pressure gauze x 24 hours.  Counseling on above issues >50% x 15 minutes.  Genia DelMarie-Lyne Kiam Bransfield MD, 10:34 AM 08/30/2017

## 2017-08-30 NOTE — Patient Instructions (Signed)
1. Epidermal inclusion cyst Epidermal inclusion cyst below the inner Rt breast causing discomfort.  Easily incised and drained.  Keep pressure gauze x 24 hours.  Sue LushAndrea, good seeing you today!

## 2018-03-16 DIAGNOSIS — S60562A Insect bite (nonvenomous) of left hand, initial encounter: Secondary | ICD-10-CM | POA: Diagnosis not present

## 2018-03-16 DIAGNOSIS — L03114 Cellulitis of left upper limb: Secondary | ICD-10-CM | POA: Diagnosis not present

## 2018-03-16 DIAGNOSIS — W57XXXA Bitten or stung by nonvenomous insect and other nonvenomous arthropods, initial encounter: Secondary | ICD-10-CM | POA: Diagnosis not present

## 2018-07-27 ENCOUNTER — Ambulatory Visit
Admission: EM | Admit: 2018-07-27 | Discharge: 2018-07-27 | Disposition: A | Discharge status: Home / Self Care | Payer: BLUE CROSS/BLUE SHIELD | Attending: Emergency Medicine | Admitting: Emergency Medicine

## 2018-07-27 ENCOUNTER — Encounter: Payer: Self-pay | Admitting: Emergency Medicine

## 2018-07-27 DIAGNOSIS — M5441 Lumbago with sciatica, right side: Secondary | ICD-10-CM

## 2018-07-27 DIAGNOSIS — M5442 Lumbago with sciatica, left side: Secondary | ICD-10-CM | POA: Insufficient documentation

## 2018-07-27 MED ORDER — IBUPROFEN 600 MG PO TABS: 600.0000 mg | ORAL_TABLET | Freq: Four times a day (QID) | ORAL | 0 refills | Status: DC | PRN

## 2018-07-27 MED ORDER — CYCLOBENZAPRINE HCL 5 MG PO TABS: 5.0000 mg | ORAL_TABLET | Freq: Every day | ORAL | 0 refills | Status: DC

## 2018-07-27 NOTE — ED Provider Notes (Signed)
[name]    CSN: [id number] Arrival date & time: 07/27/18  1524     History   Chief Complaint Chief Complaint  Patient presents with  . Back Pain    HPI [name] [name] is a 53 y.o. female history of hypertension, hyperlipidemia presenting today for evaluation of back pain.  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  Patient believes that she lifted awkwardly which is triggered her lower back pain.  Patient has noticed pain in her lower back described as a burning and aching sensation.  Occasional sensations of radiating into bilateral legs/gluteal areas.  Does endorse some tingling, but denies numbness.  Denies weakness in legs.  Denies saddle anesthesia.  Denies loss of bowel or bladder control.  She tried Aleve without improvement.  Denies previous history of issues with her back.  HPI  Past Medical History:  Diagnosis Date  . Allergy   . Hyperlipidemia    not on meds trying diet and exercise  . Hypertension    not on meds  diet and exercise    Patient Active Problem List   Diagnosis Date Noted  . Right arm pain 06/08/2017  . Hypertension, essential 05/01/2017  . Hyperlipidemia 10/20/2014  . Obesity 10/20/2014    Past Surgical History:  Procedure Laterality Date  . ABDOMINAL HYSTERECTOMY     fibroids. Dr. Faythe CasaLathram including cervix  . ABDOMINAL SURGERY     abdominoplasty- in late 30's   . CESAREAN SECTION     x2, 93 and 87  . CHOLECYSTECTOMY    . tummy tuck      OB History    Gravida  4   Para  2   Term      Preterm      AB  2   Living  2     SAB      TAB      Ectopic      Multiple      Live Births               Home Medications    Prior to Admission medications   Medication Sig Start Date End Date Taking? Authorizing Provider  aspirin EC 81 MG tablet Take 81 mg by mouth daily.    [provider]  Calcium Carbonate-Vit D-Min (CALCIUM 1200 PO) Take by mouth.    [provider]    cyclobenzaprine (FLEXERIL) 5 MG tablet Take 1-2 tablets (5-10 mg total) by mouth at bedtime. 07/27/18   Wieters, Hallie C, PA-C  ibuprofen (ADVIL,MOTRIN) 600 MG tablet Take 1 tablet (600 mg total) by mouth every 6 (six) hours as needed. 07/27/18   Wieters, Hallie C, PA-C  Multiple Vitamin (MULTIVITAMIN) tablet Take 1 tablet by mouth daily.    [provider]  Omega-3 Fatty Acids (FISH OIL) 1000 MG CAPS Take by mouth.    [provider]    Family History Family History  Problem Relation Age of Onset  . Heart disease Mother        in late 5040s, nonsmoker  . Heart attack Mother   . Hypertension Mother   . Diabetes Mother   . Diabetes Sister   . Colon cancer Neg Hx   . Colon polyps Neg Hx   . Esophageal cancer Neg Hx   . Rectal cancer Neg Hx   . Stomach cancer Neg Hx     Social History Social History   Tobacco Use  . Smoking  status: Never Smoker  . Smokeless tobacco: Never Used  Substance Use Topics  . Alcohol use: Yes    Alcohol/week: 3.0 standard drinks    Types: 3 Standard drinks or equivalent per week    Comment: 1 drink a day - liquor   . Drug use: No     Allergies   Patient has no known allergies.   Review of Systems Review of Systems  Constitutional: Negative for fatigue and fever.  Eyes: Negative for visual disturbance.  Respiratory: Negative for shortness of breath.   Cardiovascular: Negative for chest pain.  Gastrointestinal: Negative for abdominal pain, nausea and vomiting.  Genitourinary: Negative for decreased urine volume and difficulty urinating.  Musculoskeletal: Positive for back pain and myalgias. Negative for arthralgias and joint swelling.  Skin: Negative for color change, rash and wound.  Neurological: Negative for dizziness, weakness, light-headedness and headaches.     Physical Exam Triage Vital Signs ED Triage Vitals [07/27/18 1533]  Enc Vitals Group     BP (!) 168/103     Pulse Rate 77     Resp 18     Temp 97.9 F  (36.6 C)     Temp Source Oral     SpO2 98 %     Weight      Height      Head Circumference      Peak Flow      Pain Score 8     Pain Loc      Pain Edu?      Excl. in GC?    No data found.  Updated Vital Signs BP (!) 168/103 (BP Location: Left Arm)   Pulse 77   Temp 97.9 F (36.6 C) (Oral)   Resp 18   SpO2 98%   Visual Acuity Right Eye Distance:   Left Eye Distance:   Bilateral Distance:    Right Eye Near:   Left Eye Near:    Bilateral Near:     Physical Exam Vitals signs and nursing note reviewed.  Constitutional:      General: She is not in acute distress.    Appearance: She is well-developed.  HENT:     Head: Normocephalic and atraumatic.  Eyes:     Conjunctiva/sclera: Conjunctivae normal.  Neck:     Musculoskeletal: Neck supple.  Cardiovascular:     Rate and Rhythm: Normal rate and regular rhythm.     Heart sounds: No murmur.  Pulmonary:     Effort: Pulmonary effort is normal. No respiratory distress.     Breath sounds: Normal breath sounds.  Abdominal:     Palpations: Abdomen is soft.     Tenderness: There is no abdominal tenderness.  Musculoskeletal:     Comments: Nontender to palpation of thoracic and lumbar spine midline, nontender to palpation of bilateral lumbar musculature and paraspinal muscles Weakly positive straight leg raise bilaterally Strength 5/5 and equal bilaterally at hips and knees, patellar reflex 1+ bilaterally  Skin:    General: Skin is warm and dry.  Neurological:     Mental Status: She is alert.      UC Treatments / Results  Labs (all labs ordered are listed, but only abnormal results are displayed) Labs Reviewed - No data to display  EKG None  Radiology No results found.  Procedures Procedures (including critical care time)  Medications Ordered in UC Medications - No data to display  Initial Impression / Assessment and Plan / UC Course  I have reviewed the triage vital  signs and the nursing  notes.  Pertinent labs & imaging results that were available during my care of the patient were reviewed by me and considered in my medical decision making (see chart for details).    No red flags for cauda equina Patient with lower back pain secondary to heavy lifting.  Most likely muscular strain, possible nerve inflammation and irritation involvement giving burning sensation and radiation.  Will recommend treatment with anti-inflammatories and muscle relaxers.  Discussed using prednisone given radiation of pain, patient would like to defer steroids for now.  Will recommend Tylenol and ibuprofen, Flexeril.  Avoid heavy lifting.  Ice and heat.  Continue to monitor,Discussed strict return precautions. Patient verbalized understanding and is agreeable with plan.  Final Clinical Impressions(s) / UC Diagnoses   Final diagnoses:  Acute bilateral low back pain with bilateral sciatica     Discharge Instructions     Use anti-inflammatories for pain/swelling. You may take up to 600 mg Ibuprofen every 8 hours with food. You may supplement Ibuprofen with Tylenol [phone] mg every 8 hours.   You may use flexeril as needed to help with pain. This is a muscle relaxer and causes sedation- please use only at bedtime or when you will be home and not have to drive/work. Take 1-2 tablets  Alternate ice and heat  Please go about daily activities but avoid heavy lifting  Please go to emergency room if developing weakness in legs, difficulty controlling urination/bowel movements  Follow up here or PCP if symptoms persisting   ED Prescriptions    Medication Sig Dispense Auth. Provider   cyclobenzaprine (FLEXERIL) 5 MG tablet Take 1-2 tablets (5-10 mg total) by mouth at bedtime. 16 tablet Wieters, Hallie C, PA-C   ibuprofen (ADVIL,MOTRIN) 600 MG tablet Take 1 tablet (600 mg total) by mouth every 6 (six) hours as needed. 30 tablet Wieters, [place] C, PA-C     Controlled Substance Prescriptions [place]  Controlled Substance Registry consulted? Not Applicable   Lew Dawes, [place] 07/27/18 1704

## 2018-07-27 NOTE — Discharge Instructions (Signed)
Use anti-inflammatories for pain/swelling. You may take up to 600 mg Ibuprofen every 8 hours with food. You may supplement Ibuprofen with Tylenol [phone] mg every 8 hours.   You may use flexeril as needed to help with pain. This is a muscle relaxer and causes sedation- please use only at bedtime or when you will be home and not have to drive/work. Take 1-2 tablets  Alternate ice and heat  Please go about daily activities but avoid heavy lifting  Please go to emergency room if developing weakness in legs, difficulty controlling urination/bowel movements  Follow up here or PCP if symptoms persisting

## 2018-07-27 NOTE — ED Notes (Signed)
Patient able to ambulate independently  

## 2018-07-27 NOTE — ED Triage Notes (Signed)
[facility] pain starting yesterday after she helped lift a patient to standing.  C/o radiation down both legs.

## 2018-08-05 ENCOUNTER — Encounter: Payer: Self-pay | Admitting: Family Medicine

## 2018-08-05 ENCOUNTER — Ambulatory Visit: Payer: BLUE CROSS/BLUE SHIELD | Admitting: Family Medicine

## 2018-08-05 VITALS — BP 160/98 | HR 76 | Temp 97.0°F | Ht 64.5 in | Wt 228.0 lb

## 2018-08-05 DIAGNOSIS — Z23 Encounter for immunization: Secondary | ICD-10-CM

## 2018-08-05 DIAGNOSIS — M5441 Lumbago with sciatica, right side: Secondary | ICD-10-CM

## 2018-08-05 DIAGNOSIS — I10 Essential (primary) hypertension: Secondary | ICD-10-CM | POA: Diagnosis not present

## 2018-08-05 MED ORDER — AMLODIPINE BESYLATE 5 MG PO TABS: 5.0000 mg | ORAL_TABLET | Freq: Every day | ORAL | 3 refills | Status: DC

## 2018-08-05 NOTE — Assessment & Plan Note (Signed)
S: controlled poorly on no medication. Saw patient about 1.5 years ago and advised dietary changes and follow up in 4 months but patient did not return as planned.  BP Readings from Last 3 Encounters:  08/05/18 (!) 160/98  07/27/18 (!) 168/103  08/30/17 (!) 162/98  A/P: We discussed blood pressure goal of <140/90- not at goal. Start amlodipine 5 mg and then recheck in 2-3 weeks.

## 2018-08-05 NOTE — Patient Instructions (Addendum)
Your blood pressure is far too high.  I want you to avoid ibuprofen or Aleve as these can raise blood pressure further.  Please start amlodipine 5 mg.  Please schedule a 2-week follow-up so we can recheck your blood pressure- you may need a second blood pressure medication but I am hopeful we can get away with 1 medicine-may need to increase to 10 mg at follow-up  I am sorry to hear about your back.  I want you to continue to avoid lifting over 25 pounds until you are pain-free.  We did not refill muscle relaxant as it was not helpful to you.  You can schedule Tylenol extended release 2 pills twice a day to try to help during the day- glad your pain is okay at night.  We will recheck on your back pain in 2 more weeks.  My suspicion is this may last up to 6 weeks total
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
## 2018-08-06 LAB — URINALYSIS, ROUTINE W REFLEX MICROSCOPIC
Bilirubin Urine: NEGATIVE
Leukocytes, UA: NEGATIVE
Nitrite: NEGATIVE
SPECIFIC GRAVITY, URINE: 1.02 (ref 1.000–1.030)
Urine Glucose: NEGATIVE
Urobilinogen, UA: 1 (ref 0.0–1.0)
pH: 6.5 (ref 5.0–8.0)

## 2018-08-06 LAB — CBC
HCT: 39.2 % (ref 36.0–46.0)
Hemoglobin: 12.9 g/dL (ref 12.0–15.0)
MCHC: 33 g/dL (ref 30.0–36.0)
MCV: 95.5 fl (ref 78.0–100.0)
Platelets: 224 10*3/uL (ref 150.0–400.0)
RBC: 4.1 Mil/uL (ref 3.87–5.11)
RDW: 13 % (ref 11.5–15.5)
WBC: 6 10*3/uL (ref 4.0–10.5)

## 2018-08-06 LAB — LIPID PANEL
Cholesterol: 191 mg/dL (ref 0–200)
HDL: 46 mg/dL (ref >39.00)
LDL Cholesterol: 114 mg/dL — ABNORMAL HIGH (ref 0–99)
NonHDL: 144.9
Total CHOL/HDL Ratio: 4
Triglycerides: 154 mg/dL — ABNORMAL HIGH (ref 0.0–149.0)
VLDL: 30.8 mg/dL (ref 0.0–40.0)

## 2018-08-06 LAB — COMPREHENSIVE METABOLIC PANEL
ALT: 20 U/L (ref 0–35)
AST: 19 U/L (ref 0–37)
Albumin: 4.3 g/dL (ref 3.5–5.2)
Alkaline Phosphatase: 71 U/L (ref 39–117)
BUN: 17 mg/dL (ref 6–23)
CO2: 30 mEq/L (ref 19–32)
Calcium: 10.1 mg/dL (ref 8.4–10.5)
Chloride: 102 mEq/L (ref 96–112)
Creatinine, Ser: 0.86 mg/dL (ref 0.40–1.20)
GFR: 83.55 mL/min (ref >60.00)
Glucose, Bld: 93 mg/dL (ref 70–99)
Potassium: 3.8 mEq/L (ref 3.5–5.1)
Sodium: 140 mEq/L (ref 135–145)
Total Bilirubin: 0.4 mg/dL (ref 0.2–1.2)
Total Protein: 7.1 g/dL (ref 6.0–8.3)

## 2018-08-10 IMAGING — DX DG ELBOW COMPLETE 3+V*R*
4 series · 4 of 4 positions shown · non-contrast
Comparison: None.

CLINICAL DATA: Elbow pain for 1 month, no known injury, initial
encounter

EXAM:
RIGHT ELBOW - COMPLETE 3+ VIEW

[elbow ap]
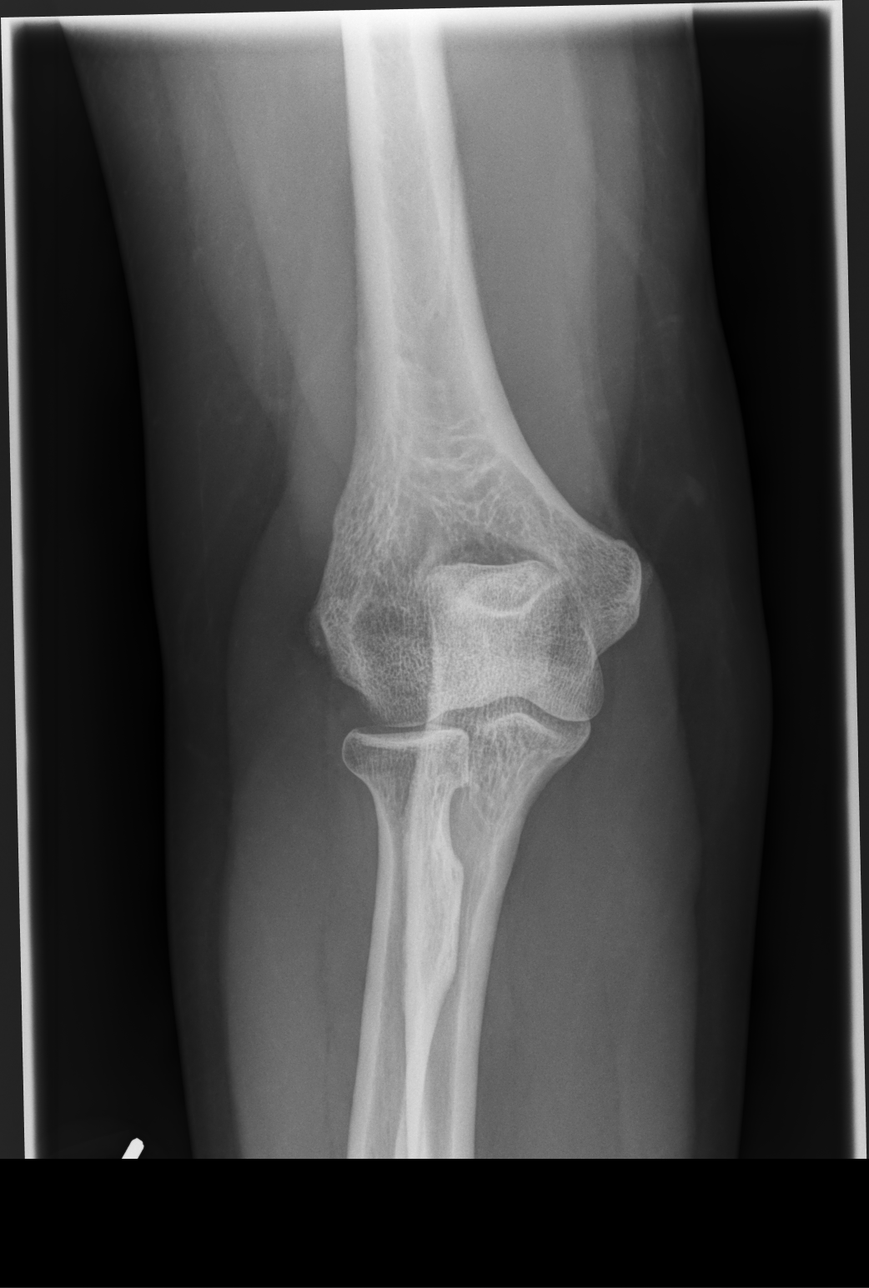

[elbow medial oblique]
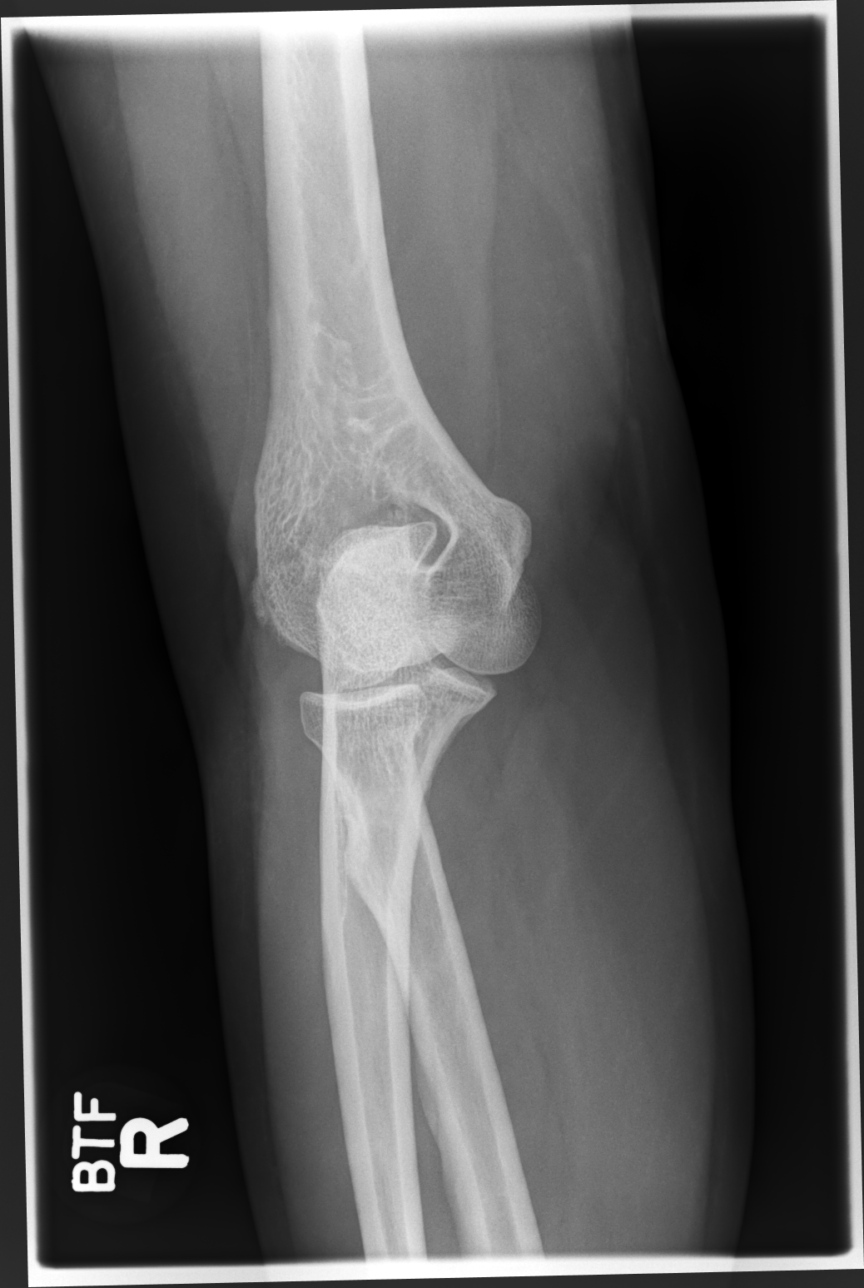

[elbow lateral oblique]
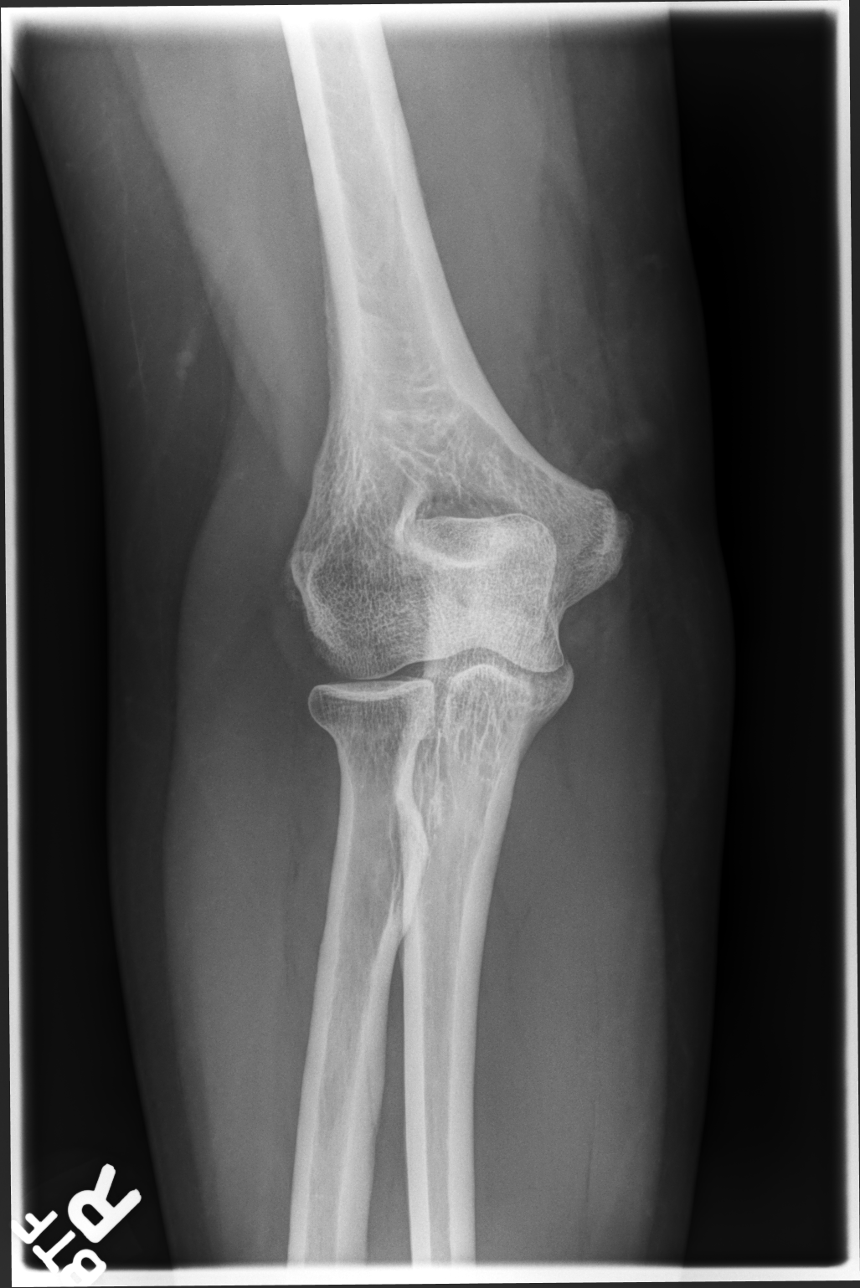

[elbow lat]
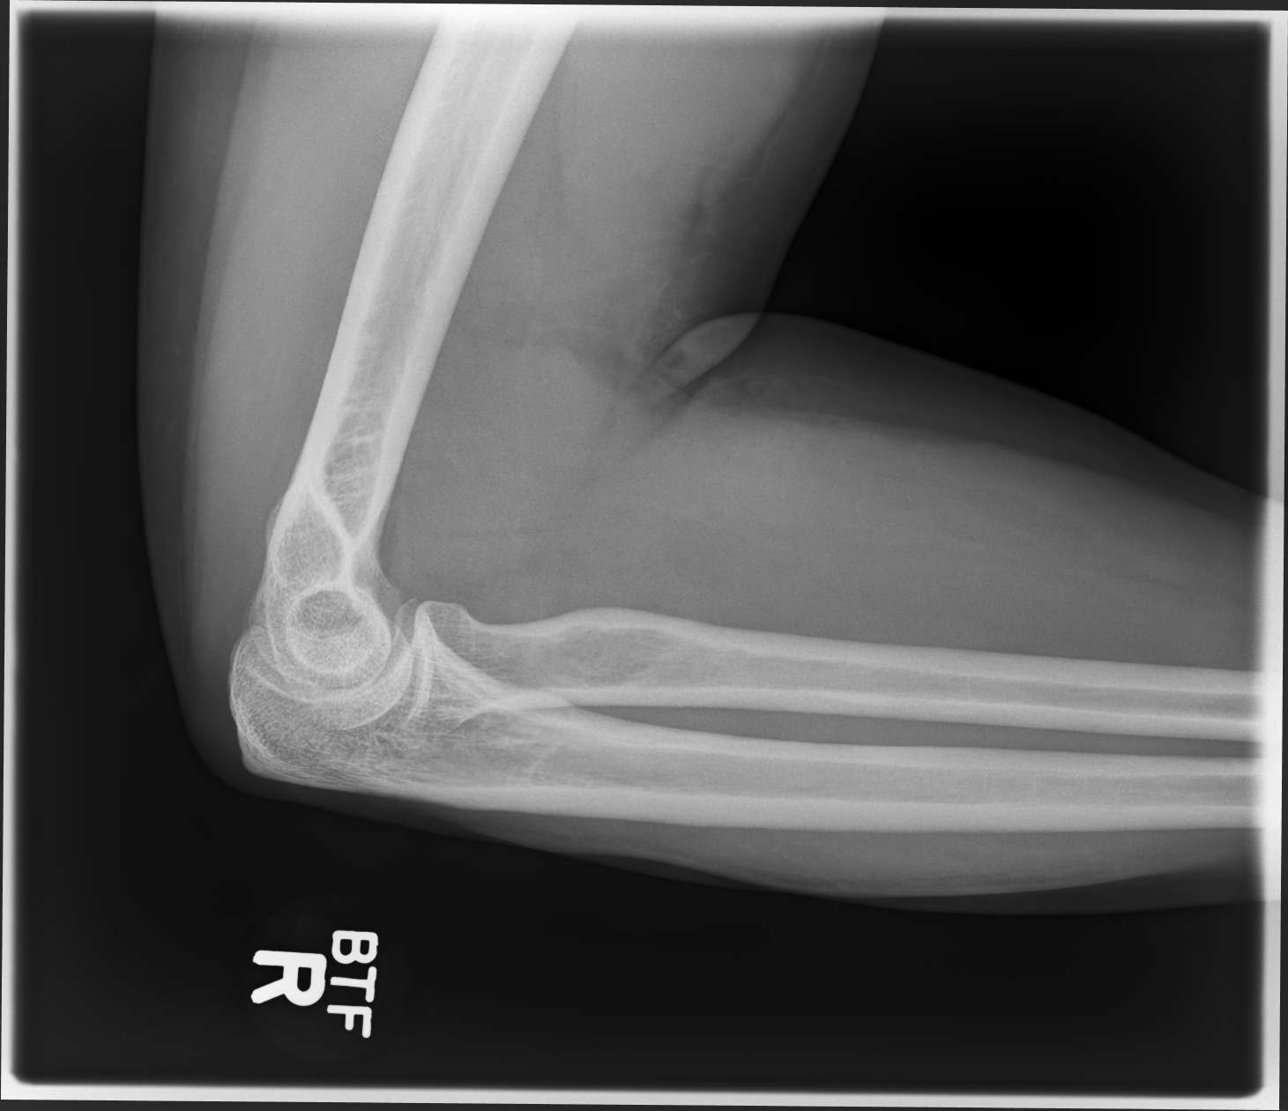

[4 of 4 positions shown; findings below may reference images not displayed]

FINDINGS: There is no evidence of fracture, dislocation, or joint effusion.
There is no evidence of arthropathy or other focal bone abnormality.
Soft tissues are unremarkable. Prominent nutrient vessel is noted in
the distal humerus.
IMPRESSION: No acute abnormality noted.

## 2018-08-14 ENCOUNTER — Telehealth: Payer: Self-pay | Admitting: Family Medicine

## 2018-08-14 NOTE — Telephone Encounter (Signed)
See note  Copied from CRM [id number]. Topic: General - Other >> Aug 14, 2018  8:57 AM [name] [name] wrote: Reason for CRM: Pt states back pain is getting worse and now going down right leg/ Pt is doing the exercise that she was advised to do and staying mobile but the pain is getting worse./ please advise

## 2018-08-14 NOTE — Telephone Encounter (Signed)
I attempted to call the patient however, her voicemail was not set up. I placed a crm for the [facility] to call the backline so the front office staff can schedule a same day appt for the patient.

## 2018-08-15 ENCOUNTER — Ambulatory Visit: Payer: BLUE CROSS/BLUE SHIELD | Admitting: Family Medicine

## 2018-08-15 ENCOUNTER — Encounter: Payer: Self-pay | Admitting: Family Medicine

## 2018-08-15 VITALS — BP 132/86 | HR 79 | Temp 98.1°F | Ht 64.5 in | Wt 227.8 lb

## 2018-08-15 DIAGNOSIS — Z6838 Body mass index (BMI) 38.0-38.9, adult: Secondary | ICD-10-CM

## 2018-08-15 DIAGNOSIS — M5441 Lumbago with sciatica, right side: Secondary | ICD-10-CM

## 2018-08-15 DIAGNOSIS — I10 Essential (primary) hypertension: Secondary | ICD-10-CM

## 2018-08-15 MED ORDER — PREDNISONE 20 MG PO TABS: ORAL_TABLET | ORAL | 0 refills | Status: DC

## 2018-08-15 NOTE — Telephone Encounter (Signed)
Patient is on the schedule for today. 

## 2018-08-15 NOTE — Progress Notes (Signed)
Phone [phone]   Subjective:  [name] [name] is a 53 y.o. year old very pleasant female patient who presents for/with See problem oriented charting ROS- no weakness in the leg, no fecal or urinary incontinence, no saddle anesthesia  BMI monitoring- elevated BMI noted: Body mass index is 38.5 kg/m. Encouraged need for healthy eating, regular exercise, weight loss.   BMI Metric Follow Up - 08/15/18 1600      BMI Metric Follow Up-Please document annually   BMI Metric Follow Up  Education provided      Past Medical History-  Patient Active Problem List   Diagnosis Date Noted  . Hypertension, essential 05/01/2017    Priority: Medium  . Right arm pain 06/08/2017  . Hyperlipidemia 10/20/2014  . Obesity 10/20/2014    Medications- reviewed and updated Current Outpatient Medications  Medication Sig Dispense Refill  . amLODipine (NORVASC) 5 MG tablet Take 1 tablet (5 mg total) by mouth daily. 90 tablet 3  . aspirin EC 81 MG tablet Take 81 mg by mouth daily.    . Calcium Carbonate-Vit D-Min (CALCIUM 1200 PO) Take by mouth.    . Multiple Vitamin (MULTIVITAMIN) tablet Take 1 tablet by mouth daily.    . Omega-3 Fatty Acids (FISH OIL) 1000 MG CAPS Take by mouth.    . predniSONE (DELTASONE) 20 MG tablet Take 2 pills for 3 days, 1 pill for 4 days 10 tablet 0   No current facility-administered medications for this visit.      Objective:  BP 132/86 (BP Location: Left Arm, Patient Position: Sitting, Cuff Size: Large)   Pulse 79   Temp 98.1 F (36.7 C) (Oral)   Ht 5' 4.5" (1.638 m)   Wt 227 lb 12.8 oz (103.3 kg)   SpO2 97%   BMI 38.50 kg/m  Gen: NAD, resting comfortably CV: RRR no murmurs rubs or gallops Lungs: CTAB no crackles, wheeze, rhonchi Ext: no edema Skin: warm, dry Neuro: grossly normal, moves all extremities MSK: Patient with significant spasm in the paraspinous muscles on the right side of low back-no midline pain.  Good extension of back- pain limits her ability  to touch her toes-appears stiffer than last visit Straight leg raise produces pain and tingling into her right thigh Neuro: 5 out of 5 muscle strength in lower extremities.    Assessment and Plan    Acute bilateral low back pain with right-sided sciatica S: continued back pain from last visit- pain shooting down into the right leg more than it was previously. Last visit we considered prednisone but held off duet o BP elevation- fortunately BP is much better today on low dose amlodipine and we have some space to trial this.   Pain level at the moment with standing up 6/10. Laying down still really helps. Ice pack not helping as much.  A/P: I am concerned patient may have a bulging disc/irritated nerve root (no red flags)- reasonable to trial a course of prednisone now that blood pressure is much improved on amlodipine.  We discussed risks to her obesity with the prednisone but I really think she should try this as a first step-can refer to sports medicine if not improved or follow-up with me.  See after visit instructions below From AVS "  Patient Instructions  Blood pressure looks excellent!  Thank you so much for taking the amlodipine-please be consistent with this.  Lets check in at least every 6 months on your blood pressure  Start prednisone-I think you have a small  bulging disc and I suspect this will calm down the inflammation associated with that.  I am hopeful within 24 to 48 hours you notice at least a slight improvement in the pain going down into your leg.  Hopeful for at least 50% improvement by 1 to 2 weeks- if not continuing to improve let me get you in with Dr. Berline Choughigby of sports medicine or I am happy to see you back to reevaluate but would consider x-rays at that point  If you have new or worsening symptoms like weakness in the leg, worsening numbness or tingling, peeing or pooping on yourself, numbness or tingling in the groin-please see us back immediately    " -Massage also  recommended verbally  Hypertension, essential S: Very well controlled on amlodipine 5 mg BP Readings from Last 3 Encounters:  08/15/18 132/86  08/05/18 (!) 160/98  07/27/18 (!) 168/103  A/P: We discussed blood pressure goal of <140/90. Continue current meds: Stable-continue with new amlodipine 5 mg  Future Appointments  Date Time Provider Department Center  02/12/2019 11:00 AM Shelva MajesticHunter, Jacquelyne Quarry O, MD [facility]   Meds ordered this encounter  Medications  . predniSONE (DELTASONE) 20 MG tablet    Sig: Take 2 pills for 3 days, 1 pill for 4 days    Dispense:  10 tablet    Refill:  0    Return precautions advised.  Tana ConchStephen Whitt Auletta, MD

## 2018-08-15 NOTE — Assessment & Plan Note (Signed)
S: Very well controlled on amlodipine 5 mg BP Readings from Last 3 Encounters:  08/15/18 132/86  08/05/18 (!) 160/98  07/27/18 (!) 168/103  A/P: We discussed blood pressure goal of <140/90. Continue current meds: Stable-continue with new amlodipine 5 mg

## 2018-08-15 NOTE — Patient Instructions (Signed)
Blood pressure looks excellent!  Thank you so much for taking the amlodipine-please be consistent with this.  Lets check in at least every 6 months on your blood pressure  Start prednisone-I think you have a small bulging disc and I suspect this will calm down the inflammation associated with that.  I am hopeful within 24 to 48 hours you notice at least a slight improvement in the pain going down into your leg.  Hopeful for at least 50% improvement by 1 to 2 weeks- if not continuing to improve let me get you in with Dr. Berline Chough of sports medicine or I am happy to see you back to reevaluate but would consider x-rays at that point  If you have new or worsening symptoms like weakness in the leg, worsening numbness or tingling, peeing or pooping on yourself, numbness or tingling in the groin-please see [place] back immediately

## 2018-08-19 ENCOUNTER — Ambulatory Visit: Payer: BLUE CROSS/BLUE SHIELD | Admitting: Family Medicine

## 2018-10-21 ENCOUNTER — Encounter: Payer: Self-pay | Admitting: Family Medicine

## 2018-10-22 NOTE — Telephone Encounter (Signed)
I'm happy to see her.  Brandy please call and get her scheduled

## 2018-10-25 ENCOUNTER — Ambulatory Visit (INDEPENDENT_AMBULATORY_CARE_PROVIDER_SITE_OTHER): Payer: BLUE CROSS/BLUE SHIELD | Admitting: Sports Medicine

## 2018-10-25 ENCOUNTER — Encounter: Payer: Self-pay | Admitting: Sports Medicine

## 2018-10-25 DIAGNOSIS — M79601 Pain in right arm: Secondary | ICD-10-CM

## 2018-10-25 MED ORDER — MELOXICAM 15 MG PO TABS: 15.0000 mg | ORAL_TABLET | Freq: Every day | ORAL | 0 refills | Status: DC

## 2018-10-25 NOTE — Progress Notes (Signed)
[name] [name]. [name] [name] Sports [facility] at [facility] [phone]  [name] [name] - 53 y.o. female MRN [id number]  Date of birth: 1966/05/13  Visit Date: 10/25/2018  PCP: Shelva Majestic, MD   Referred by: Shelva Majestic, MD   Virtual Visit via Video  I connected with [name] [name] on 10/28/18 at  2:20 PM EDT by a enabled telemedicine application and verified that I am speaking with the correct person using two identifiers. Location patient: Home Location provider: Provider office Persons participating in the virtual visit: Patient only  I discussed the limitations of evaluation and management by telemedicine and the availability of in person appointments. The patient expressed understanding and agreed to proceed.   SUBJECTIVE:   Chief Complaint  Patient presents with  . Follow-up    R elbow and arm pain.  Pennsaid rx previously.  HEP given previously    HPI: Pain between elbow and wrist Different than past visit that had previously resolved Sharp and grabbing pain that is severe and short lived Some increased neck pain prior to the onset of these symptoms Prior steroid in Feb for back issues   REVIEW OF SYSTEMS: No significant nighttime awakenings due to this issue. Denies fevers, chills, recent weight gain or weight loss.  No night sweats.  Pt denies any change in bowel or bladder habits, muscle weakness, numbness or falls associated with this pain.  HISTORY:  Prior history reviewed and updated per electronic medical record.  Patient Active Problem List   Diagnosis Date Noted  . Right arm pain 06/08/2017    Three-view x-rays of the elbow: Normal    . Hypertension, essential 05/01/2017  . Hyperlipidemia 10/20/2014    LDL 110s. 10 year risk 1.7% in 2016.    . Obesity 10/20/2014   Social History   Occupational History  . Not on file  Tobacco Use  . Smoking status: Never Smoker  . Smokeless tobacco: Never Used   Substance and Sexual Activity  . Alcohol use: Yes    Alcohol/week: 3.0 standard drinks    Types: 3 Standard drinks or equivalent per week    Comment: 1 drink a day - liquor   . Drug use: No  . Sexual activity: Yes    Partners: Male    Comment: 1st intercourse- 76, partners- ?, married- 11 months    Social History   Social History Narrative   Divorced. 2 children. 1 grandchild Fredricka Bonine born in 2016).    Dating currently.       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 HEENT: Atraumatic, conjunctiva clear, no obvious abnormalities on inspection of external nose and ears. NECK: Normal movements of the head and neck. CARDIOPULMONARY: No increased WOB. Speaking in clear sentences. I:E ratio WNL.  MS: Moves all visible extremities without noticeable abnormality. PSYCH: Pleasant and cooperative, well-groomed. Speech normal rate and rhythm. Affect is appropriate. Insight and judgement are appropriate. Attention is focused, linear, and appropriate.  NEURO: CN grossly intact. Oriented as arrived  to appointment on time with no prompting. Moves both UE equally.  SKIN: No obvious lesions, wounds, erythema, or cyanosis noted on face or hands.    ASSESSMENT:   1. Right arm pain     PROCEDURES:  None  PLAN:  Pertinent additional documentation may be included in corresponding procedure notes, imaging studies, problem based documentation and patient instructions.  No problem-specific Assessment & Plan notes found for this encounter.   Symptoms may be related to a radiculitis versus overuse of her arm.  We will start  her on prescription anti-inflammatories and continue with the home therapeutic exercises previously prescribed.  Avoid compression of the common extensor tendons and plan for inpatient follow-up if any lack of improvement.  Home Therapeutic Exercises: Continue previously prescribed home exercise program  Activity modifications and the importance of avoiding exacerbating activities (limiting pain to no more than a 4 / 10 during or following activity) recommended and discussed.   Discussed red flag symptoms that warrant earlier emergent evaluation and patient voices understanding.   Meds ordered this encounter  Medications  . meloxicam (MOBIC) 15 MG tablet    Sig: Take 1 tablet (15 mg total) by mouth daily.    Dispense:  30 tablet    Refill:  0   Lab Orders  No laboratory test(s) ordered today   Imaging Orders  No imaging studies ordered today   Referral Orders  No referral(s) requested today    Return in about 4 weeks (around 11/22/2018) for repeat clinical exam.          Andrena MewsMichael D Rigby, DO    [facility] Physician

## 2018-11-22 ENCOUNTER — Encounter: Payer: Self-pay | Admitting: Family Medicine

## 2018-11-22 ENCOUNTER — Ambulatory Visit: Payer: BLUE CROSS/BLUE SHIELD | Admitting: Family Medicine

## 2018-11-22 ENCOUNTER — Ambulatory Visit: Payer: BLUE CROSS/BLUE SHIELD | Admitting: Sports Medicine

## 2018-11-22 NOTE — Progress Notes (Signed)
  Attempted to call patient numerous times.  Unfortunately went directly to voicemail.  Encourage patient to call back if she would like to discuss.

## 2019-02-12 ENCOUNTER — Ambulatory Visit: Payer: BLUE CROSS/BLUE SHIELD | Admitting: Family Medicine

## 2019-02-12 ENCOUNTER — Encounter: Payer: Self-pay | Admitting: Family Medicine

## 2019-02-12 ENCOUNTER — Other Ambulatory Visit: Payer: Self-pay

## 2019-02-12 VITALS — BP 154/98 | HR 75 | Temp 98.3°F | Ht 64.5 in | Wt 230.2 lb

## 2019-02-12 DIAGNOSIS — I10 Essential (primary) hypertension: Secondary | ICD-10-CM

## 2019-02-12 DIAGNOSIS — E785 Hyperlipidemia, unspecified: Secondary | ICD-10-CM

## 2019-02-12 DIAGNOSIS — Z6838 Body mass index (BMI) 38.0-38.9, adult: Secondary | ICD-10-CM

## 2019-02-12 NOTE — Progress Notes (Signed)
Phone [phone]   Subjective:  [name] [name] is a 53 y.o. year old very pleasant female patient who presents for/with See problem oriented charting Chief Complaint  Patient presents with  . Follow-up  . Hypertension   ROS- Denies HA, dizziness, CP, SOB, visual changes.    Past Medical History-  Patient Active Problem List   Diagnosis Date Noted  . Hypertension, essential 05/01/2017    Priority: Medium  . Right arm pain 06/08/2017  . Hyperlipidemia 10/20/2014  . Obesity 10/20/2014   Medications- reviewed and updated Current Outpatient Medications  Medication Sig Dispense Refill  . amLODipine (NORVASC) 5 MG tablet Take 1 tablet (5 mg total) by mouth daily. 90 tablet 3  . aspirin EC 81 MG tablet Take 81 mg by mouth daily.    . Calcium Carbonate-Vit D-Min (CALCIUM 1200 PO) Take by mouth.    . meloxicam (MOBIC) 15 MG tablet Take 1 tablet (15 mg total) by mouth daily. 30 tablet 0  . Multiple Vitamin (MULTIVITAMIN) tablet Take 1 tablet by mouth daily.    . Omega-3 Fatty Acids (FISH OIL) 1000 MG CAPS Take by mouth.     No current facility-administered medications for this visit.      Objective:  BP (!) 154/98 (BP Location: Left Arm, Patient Position: Sitting, Cuff Size: Normal)   Pulse 75   Temp 98.3 F (36.8 C) (Oral)   Ht 5' 4.5" (1.638 m)   Wt 230 lb 3.2 oz (104.4 kg)   SpO2 99%   BMI 38.90 kg/m  Gen: NAD, resting comfortably behind mask CV: RRR no murmurs rubs or gallops Lungs: CTAB no crackles, wheeze, rhonchi Abdomen: soft/nontender/nondistended/normal bowel sounds.  Skin: warm, dry Neuro: Normal gait and speech    Assessment and Plan   #hypertension S: on initial check- poorly controlled on Amlodipine 5 mg daily. Not checking BP at home- has home cuff. Follows low sodium diet. Walking occasionally, not often d/t heat.  BP Readings from Last 3 Encounters:  02/12/19 (!) 154/98  08/15/18 132/86  08/05/18 (!) 160/98  A/P: Poor control-patient is worried  she may have whitecoat element but unfortunately has not been monitoring blood pressure at home- recommended she begin monitoring blood pressure at home and update me in 2 weeks-if blood pressure remains high on home readings with average of 140/90 then would increase amlodipine to 10 mg-she is agreeable to this.  After that we will follow-up in 1 month.  #hyperlipidemia S: Poorly controlled on no statin.  10-year ASCVD risk 9.8% Lab Results  Component Value Date   CHOL 191 08/05/2018   HDL 46.00 08/05/2018   LDLCALC 114 (H) 08/05/2018   TRIG 154.0 (H) 08/05/2018   CHOLHDL 4 08/05/2018   A/P: I suspect if we can get blood pressure under better control that ASCVD risk will dramatically decrease-in addition making lifestyle changes through medical weight loss program as below may help lower lipids as well- recommended 44-month physical and update lipids  The 10-year ASCVD risk score Mikey Bussing DC Jr., et al., 2013) is: 9.8%   Values used to calculate the score:     Age: 65 years     Sex: Female     Is Non-Hispanic African American: Yes     Diabetic: No     Tobacco smoker: No     Systolic Blood Pressure: 193 mmHg     Is BP treated: Yes     HDL Cholesterol: 46 mg/dL     Total Cholesterol: 191 mg/dL    #  Obesity morbid-based on BMI over 35 with hypertension and hyperlipidemia s: Poor control.  Weight appears to be trending up with COVID-19.  She tries to eat a healthy diet-today so far has had yoplait yogurt 6 am,Scrambled eggs for lunch-states still feels like it difficult for her to lose weight.  Does admit to doing a lot of cranberry juice and some sodas-recommended cutting these back Wt Readings from Last 3 Encounters:  02/12/19 230 lb 3.2 oz (104.4 kg)  08/15/18 227 lb 12.8 oz (103.3 kg)  08/05/18 228 lb (103.4 kg)  A/P: Patient would really like some more formalized help with weight loss- referred her to medical weight management program.  In the meantime encouraged her to increase exercise  and consider calorie counting to get a better idea of caloric rich foods which she may be eating-1 low hanging fruit is cutting out sugar sweetened beverages as above  Recommended follow up: 2-week MyChart update on blood pressures.  Recommended 2142-month physical-she prefers to do this with GYN most likely I told her insurance may cover 1 with me as well.  Lab/Order associations:   ICD-10-CM   1. Hypertension, essential  I10   2. Hyperlipidemia, unspecified hyperlipidemia type  E78.5   3. Morbid obesity (HCC)  E66.01 Amb Ref to Medical Weight Management  4. BMI 38.0-38.9,adult  Z68.38 Amb Ref to Medical Weight Management   Return precautions advised.  Tana ConchStephen Hunter, MD

## 2019-02-12 NOTE — Patient Instructions (Addendum)
We should have flu shots available by September. Please strongly consider getting flu shot this year. If you get your flu shot at a pharmacy- please let us know.   Blood pressure hopefully better at home- monitor at home for 2 weeks and send me a list of blood pressures from each day on mychart. If not averaging <140/90 we will increase amlodipine to 10mg  and then follow up in 1 month.    We will call you within two weeks about your referral to medical weight management . If you do not hear within 3 weeks, give [place] a call.

## 2019-02-24 ENCOUNTER — Other Ambulatory Visit: Payer: Self-pay | Admitting: Obstetrics & Gynecology

## 2019-02-24 DIAGNOSIS — Z1231 Encounter for screening mammogram for malignant neoplasm of breast: Secondary | ICD-10-CM

## 2019-02-26 ENCOUNTER — Ambulatory Visit (INDEPENDENT_AMBULATORY_CARE_PROVIDER_SITE_OTHER): Payer: BLUE CROSS/BLUE SHIELD | Admitting: Obstetrics & Gynecology

## 2019-02-26 ENCOUNTER — Ambulatory Visit
Admission: RE | Admit: 2019-02-26 | Discharge: 2019-02-26 | Disposition: A | Discharge status: Home / Self Care | Payer: BC Managed Care – PPO | Source: Ambulatory Visit

## 2019-02-26 ENCOUNTER — Encounter: Payer: Self-pay | Admitting: Obstetrics & Gynecology

## 2019-02-26 ENCOUNTER — Other Ambulatory Visit: Payer: Self-pay

## 2019-02-26 VITALS — BP 140/90 | Ht 67.0 in | Wt 231.0 lb

## 2019-02-26 DIAGNOSIS — N898 Other specified noninflammatory disorders of vagina: Secondary | ICD-10-CM

## 2019-02-26 DIAGNOSIS — Z780 Asymptomatic menopausal state: Secondary | ICD-10-CM | POA: Diagnosis not present

## 2019-02-26 DIAGNOSIS — N952 Postmenopausal atrophic vaginitis: Secondary | ICD-10-CM | POA: Diagnosis not present

## 2019-02-26 DIAGNOSIS — Z6836 Body mass index (BMI) 36.0-36.9, adult: Secondary | ICD-10-CM

## 2019-02-26 DIAGNOSIS — Z01419 Encounter for gynecological examination (general) (routine) without abnormal findings: Secondary | ICD-10-CM | POA: Diagnosis not present

## 2019-02-26 DIAGNOSIS — Z1231 Encounter for screening mammogram for malignant neoplasm of breast: Secondary | ICD-10-CM

## 2019-02-26 DIAGNOSIS — Z90710 Acquired absence of both cervix and uterus: Secondary | ICD-10-CM

## 2019-02-26 LAB — WET PREP FOR TRICH, YEAST, CLUE

## 2019-02-26 NOTE — Progress Notes (Signed)
[name] 11/15/1965 [id number]   History:    53 y.o. G4P2A2L2 Married.  RP:  Established patient presenting for annual gyn exam   HPI: S/P Hysterectomy.  No pelvic pain.  No pain with IC.  No menopausal Sx.  Breasts wnl.  Urine/BMs wnl.  Obesity, BMI 36.18.  Not physically active. Difficulty loosing weight.  HTN followed by Dr Durene CalHunter.  Health labs with Dr Durene CalHunter.  Colonoscopy 07/2017.  Past medical history,surgical history, family history and social history were all reviewed and documented in the EPIC chart.  Gynecologic History No LMP recorded. Patient has had a hysterectomy. Contraception: status post hysterectomy Last Pap: 06/2017. Results were: Negative Last mammogram: Scheduled this pm Bone Density: Never Colonoscopy: 07/2017  Obstetric History OB History  Gravida Para Term Preterm AB Living  4 2     2 2   SAB TAB Ectopic Multiple Live Births               # Outcome Date GA Lbr Len/2nd Weight Sex Delivery Anes PTL Lv  4 AB           3 AB           2 Para           1 Para              ROS: A ROS was performed and pertinent positives and negatives are included in the history.  GENERAL: No fevers or chills. HEENT: No change in vision, no earache, sore throat or sinus congestion. NECK: No pain or stiffness. CARDIOVASCULAR: No chest pain or pressure. No palpitations. PULMONARY: No shortness of breath, cough or wheeze. GASTROINTESTINAL: No abdominal pain, nausea, vomiting or diarrhea, melena or bright red blood per rectum. GENITOURINARY: No urinary frequency, urgency, hesitancy or dysuria. MUSCULOSKELETAL: No joint or muscle pain, no back pain, no recent trauma. DERMATOLOGIC: No rash, no itching, no lesions. ENDOCRINE: No polyuria, polydipsia, no heat or cold intolerance. No recent change in weight. HEMATOLOGICAL: No anemia or easy bruising or bleeding. NEUROLOGIC: No headache, seizures, numbness, tingling or weakness. PSYCHIATRIC: No depression, no loss of interest in  normal activity or change in sleep pattern.     Exam:   BP (!) 160/94   Ht 5\' 7"  (1.702 m)   Wt 231 lb (104.8 kg)   BMI 36.18 kg/m   Body mass index is 36.18 kg/m.  General appearance : Well developed well nourished female. No acute distress HEENT: Eyes: no retinal hemorrhage or exudates,  Neck supple, trachea midline, no carotid bruits, no thyroidmegaly Lungs: Clear to auscultation, no rhonchi or wheezes, or rib retractions  Heart: Regular rate and rhythm, no murmurs or gallops Breast:Examined in sitting and supine position were symmetrical in appearance, no palpable masses or tenderness,  no skin retraction, no nipple inversion, no nipple discharge, no skin discoloration, no axillary or supraclavicular lymphadenopathy Abdomen: no palpable masses or tenderness, no rebound or guarding Extremities: no edema or skin discoloration or tenderness  Pelvic: Vulva: Normal             Vagina: No gross lesions or discharge  Cervix/Uterus absent  Adnexa  Without masses or tenderness  Anus: Normal  Wet prep negative   Assessment/Plan:  53 y.o. female for annual exam   1. Well female exam with routine gynecological exam Gynecologic exam status post total hysterectomy.  Pap test in December 2018 was negative, no indication to repeat this year.  Breast exam normal.  Screening mammogram scheduled  today.  Colonoscopy January 2019.  Health labs with family physician.  2. Hx of total hysterectomy  3. Postmenopause Well on no hormone replacement therapy.  Recommend vitamin D supplements, calcium intake of 1200 mg daily and regular weightbearing physical activities.  4. Postmenopausal atrophic vaginitis Recommend using coconut oil as needed.  5. Vaginal irritation Wet prep negative.  Patient reassured.  Counseling done. - WET PREP FOR [place], YEAST, CLUE  6. Class 2 severe obesity due to excess calories with serious comorbidity and body mass index (BMI) of 36.0 to 36.9 in adult [facility])  Recommend a lower calorie/carb diet such as [employer].  Aerobic physical activities 5 times a week and weightlifting every 2 days.  Princess Bruins MD, 2:10 PM 02/26/2019

## 2019-03-03 ENCOUNTER — Encounter: Payer: Self-pay | Admitting: Obstetrics & Gynecology

## 2019-03-03 NOTE — Patient Instructions (Signed)
1. Well female exam with routine gynecological exam Gynecologic exam status post total hysterectomy.  Pap test in December 2018 was negative, no indication to repeat this year.  Breast exam normal.  Screening mammogram scheduled today.  Colonoscopy January 2019.  Health labs with family physician.  2. Hx of total hysterectomy  3. Postmenopause Well on no hormone replacement therapy.  Recommend vitamin D supplements, calcium intake of 1200 mg daily and regular weightbearing physical activities.  4. Postmenopausal atrophic vaginitis Recommend using coconut oil as needed.  5. Vaginal irritation Wet prep negative.  Patient reassured.  Counseling done. - WET PREP FOR [place], YEAST, CLUE  6. Class 2 severe obesity due to excess calories with serious comorbidity and body mass index (BMI) of 36.0 to 36.9 in adult [facility]) Recommend a lower calorie/carb diet such as [place] [employer].  Aerobic physical activities 5 times a week and weightlifting every 2 days.  [name] [name], it was a pleasure seeing you today!
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
## 2019-04-18 ENCOUNTER — Encounter: Payer: Self-pay | Admitting: Family Medicine

## 2019-04-18 ENCOUNTER — Ambulatory Visit: Payer: BC Managed Care – PPO | Admitting: Family Medicine

## 2019-04-18 ENCOUNTER — Other Ambulatory Visit: Payer: Self-pay

## 2019-04-18 VITALS — BP 170/90 | HR 78 | Temp 98.7°F | Ht 67.0 in | Wt 234.2 lb

## 2019-04-18 DIAGNOSIS — I10 Essential (primary) hypertension: Secondary | ICD-10-CM

## 2019-04-18 DIAGNOSIS — M7711 Lateral epicondylitis, right elbow: Secondary | ICD-10-CM

## 2019-04-18 DIAGNOSIS — M722 Plantar fascial fibromatosis: Secondary | ICD-10-CM | POA: Diagnosis not present

## 2019-04-18 DIAGNOSIS — M67442 Ganglion, left hand: Secondary | ICD-10-CM

## 2019-04-18 DIAGNOSIS — Z23 Encounter for immunization: Secondary | ICD-10-CM | POA: Diagnosis not present

## 2019-04-18 LAB — URINALYSIS, ROUTINE W REFLEX MICROSCOPIC
Bilirubin Urine: NEGATIVE
Ketones, ur: NEGATIVE
Nitrite: NEGATIVE
Specific Gravity, Urine: 1.025 (ref 1.000–1.030)
Total Protein, Urine: NEGATIVE
Urine Glucose: NEGATIVE
Urobilinogen, UA: 1 (ref 0.0–1.0)
pH: 6 (ref 5.0–8.0)

## 2019-04-18 MED ORDER — AMLODIPINE BESYLATE 10 MG PO TABS: 10.0000 mg | ORAL_TABLET | Freq: Every day | ORAL | 3 refills | Status: DC

## 2019-04-18 MED ORDER — DICLOFENAC SODIUM 1 % TD GEL: 2.0000 g | Freq: Four times a day (QID) | TRANSDERMAL | 3 refills | Status: DC

## 2019-04-18 NOTE — Addendum Note (Signed)
Addended by: Francella Solian on: 04/18/2019 08:39 AM   Modules accepted: Orders

## 2019-04-18 NOTE — Patient Instructions (Addendum)
Health Maintenance Due  Topic Date Due  . INFLUENZA VACCINE -today 02/08/2019   For blood pressure- 1.  Increase amlodipine to 10 mg 2.  Could consider calorie counting with myfitnesspal  - The Dash eating plan is also helpful for blood pressure 3.  Continue exercise efforts 4.  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  Start Voltaren gel 4 times a day for at least 7 days (this ended up being the OTC version 1% as the 2% version I found can be very expensive)  2.  Resume home exercises previously given 3.  If not improving in 10 to 14 days and let us refer you to Dr. Katrinka Blazing of sports medicine  Left finger pain 1.  Can use very very small amount of Voltaren 2.  If not improving-can refer to Dr. Katrinka Blazing of sports medicine  Right heel pain-due to plantar fasciitis 1.  Team please give her handout on plantar fasciitis.  Do exercises 3 times a week for a month and then once a week 2.  Ice the heel 3 times a day for 20 minutes for the next 3 days- the frozen bottle of water can be particularly helpful for this if you roll it like in the pictures 3.  If not improving we can refer you to Dr. Katrinka Blazing of sports medicine        DASH Eating Plan DASH stands for "Dietary Approaches to Stop Hypertension." The DASH eating plan is a healthy eating plan that has been shown to reduce high blood pressure (hypertension). It may also reduce your risk for type 2 diabetes, heart disease, and stroke. The DASH eating plan may also help with weight loss. What are tips for following this plan?  General guidelines  Avoid eating more than 2,300 mg (milligrams) of salt (sodium) a day. If you have hypertension, you may need to reduce your sodium intake to 1,500 mg a day.  Limit alcohol intake to no more than 1 drink a day for nonpregnant women and 2 drinks a day for men. One drink  equals 12 oz of beer, 5 oz of wine, or 1 oz of hard liquor.  Work with your health care provider to maintain a healthy body weight or to lose weight. Ask what an ideal weight is for you.  Get at least 30 minutes of exercise that causes your heart to beat faster (aerobic exercise) most days of the week. Activities may include walking, swimming, or biking.  Work with your health care provider or diet and nutrition specialist (dietitian) to adjust your eating plan to your individual calorie needs. Reading food labels   Check food labels for the amount of sodium per serving. Choose foods with less than 5 percent of the Daily Value of sodium. Generally, foods with less than 300 mg of sodium per serving fit into this eating plan.  To find whole grains, look for the word "whole" as the first word in the ingredient list. Shopping  Buy products labeled as "low-sodium" or "no salt added."  Buy fresh foods. Avoid canned foods and premade or frozen meals. Cooking  Avoid adding salt when cooking. Use salt-free seasonings or herbs instead of table salt or sea salt. Check with your health care provider or pharmacist before using salt substitutes.  Do not fry foods. Cook foods using healthy methods such as baking,  boiling, grilling, and broiling instead.  Cook with heart-healthy oils, such as olive, canola, soybean, or sunflower oil. Meal planning  Eat a balanced diet that includes: ? 5 or more servings of fruits and vegetables each day. At each meal, try to fill half of your plate with fruits and vegetables. ? Up to 6-8 servings of whole grains each day. ? Less than 6 oz of lean meat, poultry, or fish each day. A 3-oz serving of meat is about the same size as a deck of cards. One egg equals 1 oz. ? 2 servings of low-fat dairy each day. ? A serving of nuts, seeds, or beans 5 times each week. ? Heart-healthy fats. Healthy fats called Omega-3 fatty acids are found in foods such as flaxseeds and  coldwater fish, like sardines, salmon, and mackerel.  Limit how much you eat of the following: ? Canned or prepackaged foods. ? Food that is high in trans fat, such as fried foods. ? Food that is high in saturated fat, such as fatty meat. ? Sweets, desserts, sugary drinks, and other foods with added sugar. ? Full-fat dairy products.  Do not salt foods before eating.  Try to eat at least 2 vegetarian meals each week.  Eat more home-cooked food and less restaurant, buffet, and fast food.  When eating at a restaurant, ask that your food be prepared with less salt or no salt, if possible. What foods are recommended? The items listed may not be a complete list. Talk with your dietitian about what dietary choices are best for you. Grains Whole-grain or whole-wheat bread. Whole-grain or whole-wheat pasta. Brown rice. Orpah Cobbatmeal. Quinoa. Bulgur. Whole-grain and low-sodium cereals. Pita bread. Low-fat, low-sodium crackers. Whole-wheat flour tortillas. Vegetables Fresh or frozen vegetables (raw, steamed, roasted, or grilled). Low-sodium or reduced-sodium tomato and vegetable juice. Low-sodium or reduced-sodium tomato sauce and tomato paste. Low-sodium or reduced-sodium canned vegetables. Fruits All fresh, dried, or frozen fruit. Canned fruit in natural juice (without added sugar). Meat and other protein foods Skinless chicken or [place]. Ground chicken or [place]. Pork with fat trimmed off. Fish and seafood. Egg whites. Dried beans, peas, or lentils. Unsalted nuts, nut butters, and seeds. Unsalted canned beans. Lean cuts of beef with fat trimmed off. Low-sodium, lean deli meat. Dairy Low-fat (1%) or fat-free (skim) milk. Fat-free, low-fat, or reduced-fat cheeses. Nonfat, low-sodium ricotta or cottage cheese. Low-fat or nonfat yogurt. Low-fat, low-sodium cheese. Fats and oils Soft margarine without trans fats. Vegetable oil. Low-fat, reduced-fat, or light mayonnaise and salad dressings (reduced-sodium).  Canola, safflower, olive, soybean, and sunflower oils. Avocado. Seasoning and other foods Herbs. Spices. Seasoning mixes without salt. Unsalted popcorn and pretzels. Fat-free sweets. What foods are not recommended? The items listed may not be a complete list. Talk with your dietitian about what dietary choices are best for you. Grains Baked goods made with fat, such as croissants, muffins, or some breads. Dry pasta or rice meal packs. Vegetables Creamed or fried vegetables. Vegetables in a cheese sauce. Regular canned vegetables (not low-sodium or reduced-sodium). Regular canned tomato sauce and paste (not low-sodium or reduced-sodium). Regular tomato and vegetable juice (not low-sodium or reduced-sodium). Rosita FirePickles. Olives. Fruits Canned fruit in a light or heavy syrup. Fried fruit. Fruit in cream or butter sauce. Meat and other protein foods Fatty cuts of meat. Ribs. Fried meat. Tomasa BlaseBacon. Sausage. Bologna and other processed lunch meats. Salami. Fatback. Hotdogs. Bratwurst. Salted nuts and seeds. Canned beans with added salt. Canned or smoked fish. Whole eggs or egg yolks. Chicken  or [place] with skin. Dairy Whole or 2% milk, cream, and half-and-half. Whole or full-fat cream cheese. Whole-fat or sweetened yogurt. Full-fat cheese. Nondairy creamers. Whipped toppings. Processed cheese and cheese spreads. Fats and oils Butter. Stick margarine. Lard. Shortening. Ghee. Bacon fat. Tropical oils, such as coconut, palm kernel, or palm oil. Seasoning and other foods Salted popcorn and pretzels. Onion salt, garlic salt, seasoned salt, table salt, and sea salt. Worcestershire sauce. Tartar sauce. Barbecue sauce. Teriyaki sauce. Soy sauce, including reduced-sodium. Steak sauce. Canned and packaged gravies. Fish sauce. Oyster sauce. Cocktail sauce. Horseradish that you find on the shelf. Ketchup. Mustard. Meat flavorings and tenderizers. Bouillon cubes. Hot sauce and Tabasco sauce. Premade or packaged marinades.  Premade or packaged taco seasonings. Relishes. Regular salad dressings. Where to find more information:  National Heart, Lung, and [place]: https://wilson-eaton.com/  American Heart Association: www.heart.org Summary  The DASH eating plan is a healthy eating plan that has been shown to reduce high blood pressure (hypertension). It may also reduce your risk for type 2 diabetes, heart disease, and stroke.  With the DASH eating plan, you should limit salt (sodium) intake to 2,300 mg a day. If you have hypertension, you may need to reduce your sodium intake to 1,500 mg a day.  When on the DASH eating plan, aim to eat more fresh fruits and vegetables, whole grains, lean proteins, low-fat dairy, and heart-healthy fats.  Work with your health care provider or diet and nutrition specialist (dietitian) to adjust your eating plan to your individual calorie needs. This information is not intended to replace advice given to you by your health care provider. Make sure you discuss any questions you have with your health care provider. Document Released: 06/15/2011 Document Revised: 06/08/2017 Document Reviewed: 06/19/2016 Elsevier Patient Education  2020 [employer].

## 2019-04-22 ENCOUNTER — Encounter: Payer: Self-pay | Admitting: Family Medicine

## 2019-04-23 ENCOUNTER — Encounter: Payer: Self-pay | Admitting: Family Medicine

## 2019-04-24 ENCOUNTER — Encounter: Payer: Self-pay | Admitting: Family Medicine

## 2019-05-27 ENCOUNTER — Other Ambulatory Visit: Payer: Self-pay

## 2019-05-27 ENCOUNTER — Ambulatory Visit: Payer: BC Managed Care – PPO | Admitting: Family Medicine

## 2019-05-27 ENCOUNTER — Encounter: Payer: Self-pay | Admitting: Family Medicine

## 2019-05-27 VITALS — BP 130/78 | HR 73 | Temp 98.3°F | Ht 67.0 in | Wt 234.4 lb

## 2019-05-27 DIAGNOSIS — M79645 Pain in left finger(s): Secondary | ICD-10-CM | POA: Diagnosis not present

## 2019-05-27 DIAGNOSIS — I10 Essential (primary) hypertension: Secondary | ICD-10-CM

## 2019-05-27 DIAGNOSIS — M25521 Pain in right elbow: Secondary | ICD-10-CM

## 2019-05-27 NOTE — Patient Instructions (Addendum)
Goal blood pressure is to remain 138/88 or lower- please check at least every week or every other week to make sure stays in this range and if above range- please see [place] back sooner. Otherwise lets see each other in 6 months. Continue amlodipine 10 mg  Recommended follow up: 6 months physical- check with insurance but they likely allow you to have one here and with GYN  Please schedule a visit with Dr. Georgina Snell for a day later this week that works for you before you leave- at the check out desk

## 2019-05-27 NOTE — Progress Notes (Signed)
Phone [phone] In person visit   Subjective:   Navya Timmons is a 53 y.o. year old very pleasant female patient who presents for/with See problem oriented charting Chief Complaint  Patient presents with  . Follow-up  . Hypertension   ROS- No chest pain or shortness of breath. No headache or blurry vision.    Past Medical History-  Patient Active Problem List   Diagnosis Date Noted  . Hypertension, essential 05/01/2017    Priority: Medium  . Right arm pain 06/08/2017  . Hyperlipidemia 10/20/2014  . Obesity 10/20/2014    Medications- reviewed and updated Current Outpatient Medications  Medication Sig Dispense Refill  . amLODipine (NORVASC) 10 MG tablet Take 1 tablet (10 mg total) by mouth daily. 90 tablet 3  . aspirin EC 81 MG tablet Take 81 mg by mouth daily.    . Calcium Carbonate-Vit D-Min (CALCIUM 1200 PO) Take by mouth.    . diclofenac sodium (VOLTAREN) 1 % GEL Apply 2 g topically 4 (four) times daily. 100 g 3  . Multiple Vitamin (MULTIVITAMIN) tablet Take 1 tablet by mouth daily.    . Omega-3 Fatty Acids (FISH OIL) 1000 MG CAPS Take by mouth.     No current facility-administered medications for this visit.      Objective:  BP 130/78 Comment: Simultaneous filing. User may not have seen previous data.  Pulse 73   Temp 98.3 F (36.8 C)   Ht 5\' 7"  (1.702 m)   Wt 234 lb 6.4 oz (106.3 kg)   SpO2 98%   BMI 36.71 kg/m  Gen: NAD, resting comfortably CV: RRR no murmurs rubs or gallops Lungs: CTAB no crackles, wheeze, rhonchi Abdomen: soft/nontender/nondistended/normal bowel sounds.  Ext: no edema Skin: warm, dry Msk: pain over right lateral epicondyle. Bulge over left PIP- tender to palpation.     Assessment and Plan   #hypertension S: compliant with amlodipine 10mg   Last visit patient only on amlodipine 5 mg-patient was walking 30 to 35 minutes twice a week and trying to work on her diet.  Home readings at that time were in the 505L systolic with  controlled diastolic.  She has not been monitoring blood pressure recently- agrees to restart. Doing some elliptical but less walking. Weight stable  BP Readings from Last 3 Encounters:  05/27/19 130/78  04/18/19 (!) 170/90  02/26/19 140/90  A/P: at goal today from avs "Goal blood pressure is to remain 138/88 or lower- please check at least every week or every other week to make sure stays in this range and if above range- please see [place] back sooner. Otherwise lets see each other in 6 months. Continue amlodipine 10 mg"  Elbow Pain S:pt states the cream you Rx'd for her did not help, she still feels a pull and pressure when she lifts her arm or turns it to the left or right. Tried to let heat run over it with water in shower. Worse with writing and trying to work on her hair. Also tried copper sleeve.   She is doing home exercises Dr. Paulla Fore had given her.  A/P: I still think this is lateral epicondylitis. Has not responded to conservative measures including using copper sleeve and voltaren gel. Has been over a month- will refer to Dr. Georgina Snell of sports medicine for his expert opinion and assistance- she will schedule before she goes   Left finger pain- does help with voltaren gel- she is going to discuss these areas with Dr. Georgina Snell also.   Recommended  follow up: 6 months physical- check with insurance but they likely allow you to have one here and with GYN  Lab/Order associations:   ICD-10-CM   1. Hypertension, essential  I10   2. Right elbow pain  M25.521 Ambulatory referral to Sports Medicine  3. Pain of left middle finger  M79.645 Ambulatory referral to Sports Medicine   Return precautions advised.  [name], MD

## 2019-05-29 ENCOUNTER — Ambulatory Visit: Payer: BC Managed Care – PPO | Admitting: Family Medicine

## 2019-05-29 ENCOUNTER — Encounter: Payer: Self-pay | Admitting: Family Medicine

## 2019-05-29 ENCOUNTER — Other Ambulatory Visit: Payer: Self-pay

## 2019-05-29 ENCOUNTER — Ambulatory Visit: Payer: Self-pay

## 2019-05-29 VITALS — BP 140/84 | HR 76 | Ht 67.0 in | Wt 235.8 lb

## 2019-05-29 DIAGNOSIS — R2232 Localized swelling, mass and lump, left upper limb: Secondary | ICD-10-CM | POA: Diagnosis not present

## 2019-05-29 DIAGNOSIS — M25531 Pain in right wrist: Secondary | ICD-10-CM | POA: Diagnosis not present

## 2019-05-29 LAB — CBC WITH DIFFERENTIAL/PLATELET
Basophils Absolute: 0.1 10*3/uL (ref 0.0–0.1)
Basophils Relative: 0.8 % (ref 0.0–3.0)
Eosinophils Absolute: 0.2 10*3/uL (ref 0.0–0.7)
Eosinophils Relative: 3.1 % (ref 0.0–5.0)
HCT: 38.3 % (ref 36.0–46.0)
Hemoglobin: 12.6 g/dL (ref 12.0–15.0)
Lymphocytes Relative: 45.7 % (ref 12.0–46.0)
Lymphs Abs: 2.9 10*3/uL (ref 0.7–4.0)
MCHC: 32.8 g/dL (ref 30.0–36.0)
MCV: 95.4 fl (ref 78.0–100.0)
Monocytes Absolute: 0.5 10*3/uL (ref 0.1–1.0)
Monocytes Relative: 7 % (ref 3.0–12.0)
Neutro Abs: 2.8 10*3/uL (ref 1.4–7.7)
Neutrophils Relative %: 43.4 % (ref 43.0–77.0)
Platelets: 240 10*3/uL (ref 150.0–400.0)
RBC: 4.02 Mil/uL (ref 3.87–5.11)
RDW: 12.7 % (ref 11.5–15.5)
WBC: 6.4 10*3/uL (ref 4.0–10.5)

## 2019-05-29 LAB — URIC ACID: Uric Acid, Serum: 3.7 mg/dL (ref 2.4–7.0)

## 2019-05-29 LAB — SEDIMENTATION RATE: Sed Rate: 31 mm/hr — ABNORMAL HIGH (ref 0–30)

## 2019-05-29 LAB — CK: Total CK: 193 U/L — ABNORMAL HIGH (ref 7–177)

## 2019-05-29 NOTE — Progress Notes (Signed)
Subjective:    I'm seeing this patient as a consultation for:  Dr. Yong Channel  CC: R elbow and L middle finger  I, Molly Weber, LAT, ATC, am serving as scribe for Dr. Lynne Leader.  HPI: Pt is a 53 y/o female presenting w/ c/o R elbow pain x several months .  She has tried Voltaren gel and a Copper sleeve brace and has been doing home exercises w/ no relief.  Pt rates her pain as a 5-6/10 currently and describes it as a mix of a pulling-type pain and sharp, jagger-type pain.  Pt's symptoms are aggravated w/ writing, forearm rotation, and doing her hair.  Pain will radiate from her wrist up to her elbow and down to her R 4th and 5th fingers.  She denies any N/T in her R UE.    L middle finger: She notes having nodules at her L 3rd finger PIP joint.  Pt states that her nodules have increased in size and are becoming increasingly more painful.  Pt describes her pain as a burning pain and rates it as a 5-6/10.  Aggravating factors include pressure to the area or hitting her PIP joint against anything.  She has tried to manually rub the area when the nodules are irritated.  Past medical history, Surgical history, Family history not pertinant except as noted below, Social history, Allergies, and medications have been entered into the medical record, reviewed, and no changes needed.   Review of Systems: No headache, visual changes, nausea, vomiting, diarrhea, constipation, dizziness, abdominal pain, skin rash, fevers, chills, night sweats, weight loss, swollen lymph nodes, body aches, joint swelling, muscle aches, chest pain, shortness of breath, mood changes, visual or auditory hallucinations.   Objective:    Vitals:   05/29/19 1309  BP: 140/84  Pulse: 76  SpO2: 98%   General: Well Developed, well nourished, and in no acute distress.  Neuro/Psych: Alert and oriented x3, extra-ocular muscles intact, able to move all 4 extremities, sensation grossly intact. Skin: Warm and dry, no rashes noted.   Respiratory: Not using accessory muscles, speaking in full sentences, trachea midline.  Cardiovascular: Pulses palpable, no extremity edema. Abdomen: Does not appear distended. MSK:  Right elbow normal-appearing normal motion not particularly tender palpation.  Not significant pain to resisted wrist extension. Normal strength.  Right wrist normal-appearing Normal motion. Not particular tender to palpation. Strength is intact. Pain is not reproducible or with resisted wrist extension flexion ulnar deviation or radial deviation.  Pulses cap refill and sensation are intact distally.  Left hand: 2 small nodules dorsal left third PIP.  No mobile.  Mildly tender to palpation.  Finger motion and strength are normal.  Hand is otherwise normal with normal pulses cap refill and sensation.  Lab and Radiology Results  Limited musculoskeletal ultrasound right dorsal ulnar wrist. Normal-appearing intact fourth fifth and sixth dorsal wrist compartments with normal tendon structures and tendon sheath. Hypoechoic mobile small circular structure approximately 5-10 mm across deep to extensor carpi ulnaris tendon present in dorsal ulnar wrist.  Normal bony structures. Impression: Dorsal ulnar ganglion cyst impinging on extensor carpi ulnaris tendon.  Left third PIP dorsal nodule.  Solid structure.  No hypoechoic ganglion cyst present.  Normal bony structures. Impression: Solid nodule no ganglion cyst.  Procedure: Real-time Ultrasound Guided Injection of right dorsal ulnar wrist ganglion cyst Device: Philips Affiniti 50G Images permanently stored and available for review in the ultrasound unit. Verbal informed consent obtained.  Discussed risks and benefits of procedure. Warned  about infection bleeding damage to structures skin hypopigmentation and fat atrophy among others. Patient expresses understanding and agreement Time-out conducted.   Noted no overlying erythema, induration, or other signs of  local infection.   Skin prepped in a sterile fashion.   Local anesthesia: Topical Ethyl chloride.   With sterile technique and under real time ultrasound guidance:  40 mg of Kenalog and 1 mL of Marcaine injected easily.   Completed without difficulty   Pain immediately resolved suggesting accurate placement of the medication.   Advised to call if fevers/chills, erythema, induration, drainage, or persistent bleeding.   Images permanently stored and available for review in the ultrasound unit.  Impression: Technically successful ultrasound guided injection.       Impression and Recommendations:    Assessment and Plan: 53 y.o. female with  Right wrist pain.  Dorsal ulnar ganglion cyst affecting motion of the wrist likely impinging on extensor carpi ulnaris tendon causing pain and dysfunction.  Plan for injection at ganglion cyst.  Cystic structure too small and deep for aspiration attempt.  We will also refer to hand therapy.  Recheck back in 1 month.  Left dorsal third PIP nodule.  Unclear etiology.  This is her nondominant hand and the nodular structure is solid-appearing.  This could represent a rheumatologic process.  Plan for limited rheumatologic work-up listed below including CBC ANA CK uric acid rheumatoid factor sed rate CCP. Recheck back 1 month.  PDMP not reviewed this encounter. Orders Placed This Encounter  Procedures  . [place] - Upper Extremity - Limited - RIGHT    Order Specific Question:   Reason for Exam (SYMPTOM  OR DIAGNOSIS REQUIRED)    Answer:   R elbow pain    Order Specific Question:   Preferred imaging location?    Answer:   [facility]  . Sedimentation rate  . Rheumatoid factor  . ANA  . CK  . Cyclic citrul peptide antibody, IgG  (Rheumatoid Arthritis)  . Uric acid  . CBC with Differential  . COMPLETE METABOLIC PANEL WITH GFR  . Ambulatory referral to Occupational Therapy    Referral Priority:   Routine    Referral Type:   Occupational Therapy     Referral Reason:   Specialty Services Required    Requested Specialty:   Occupational Therapy    Number of Visits Requested:   1   No orders of the defined types were placed in this encounter.   Discussed warning signs or symptoms. Please see discharge instructions. Patient expresses understanding.  The above documentation has been reviewed and is accurate and complete Clementeen Graham

## 2019-05-29 NOTE — Patient Instructions (Signed)
Thank you for coming in today. Get labs today.  I will get results to you ASAP.  Attend Hand Therapy. You should hear from occupational therapy soon.  Recheck in about 1 month.  Return sooner if needed.  Let me know if you have any problems.   Call or go to the ER if you develop a large red swollen joint with extreme pain or oozing puss.   We're moving!  Dr. Zollie Pee new office will be located at [address] on the 1st floor.  This location is across the street from the [employer] and in the same complex as the [facility] and [employer]  Our new office phone number will be [phone].  We anticipate beginning to see patients at the [facility] office in early December 2020.  Ganglion Cyst  A ganglion cyst is a non-cancerous, fluid-filled lump that occurs near a joint or tendon. The cyst grows out of a joint or the lining of a tendon. Ganglion cysts most often develop in the hand or wrist, but they can also develop in the shoulder, elbow, hip, knee, ankle, or foot. Ganglion cysts are ball-shaped or egg-shaped. Their size can range from the size of a pea to larger than a grape. Increased activity may cause the cyst to get bigger because more fluid starts to build up. What are the causes? The exact cause of this condition is not known, but it may be related to:  Inflammation or irritation around the joint.  An injury.  Repetitive movements or overuse.  Arthritis. What increases the risk? You are more likely to develop this condition if:  You are a woman.  You are 39-62 years old. What are the signs or symptoms? The main symptom of this condition is a lump. It most often appears on the hand or wrist. In many cases, there are no other symptoms, but a cyst can sometimes cause:  Tingling.  Pain.  Numbness.  Muscle weakness.  Weak grip.  Less range of motion in a joint. How is this diagnosed? Ganglion cysts are usually diagnosed based on a  physical exam. Your health care provider will feel the lump and may shine a light next to it. If it is a ganglion cyst, the light will likely shine through it. Your health care provider may order an X-ray, ultrasound, or MRI to rule out other conditions. How is this treated? Ganglion cysts often go away on their own without treatment. If you have pain or other symptoms, treatment may be needed. Treatment is also needed if the ganglion cyst limits your movement or if it gets infected. Treatment may include:  Wearing a brace or splint on your wrist or finger.  Taking anti-inflammatory medicine.  Having fluid drained from the lump with a needle (aspiration).  Getting a steroid injected into the joint.  Having surgery to remove the ganglion cyst.  Placing a pad on your shoe or wearing shoes that will not rub against the cyst if it is on your foot. Follow these instructions at home:  Do not press on the ganglion cyst, poke it with a needle, or hit it.  Take over-the-counter and prescription medicines only as told by your health care provider.  If you have a brace or splint: ? Wear it as told by your health care provider. ? Remove it as told by your health care provider. Ask if you need to remove it when you take a shower or a bath.  Watch  your ganglion cyst for any changes.  Keep all follow-up visits as told by your health care provider. This is important. Contact a health care provider if:  Your ganglion cyst becomes larger or more painful.  You have pus coming from the lump.  You have weakness or numbness in the affected area.  You have a fever or chills. Get help right away if:  You have a fever and have any of these in the cyst area: ? Increased redness. ? Red streaks. ? Swelling. Summary  A ganglion cyst is a non-cancerous, fluid-filled lump that occurs near a joint or tendon.  Ganglion cysts most often develop in the hand or wrist, but they can also develop in the  shoulder, elbow, hip, knee, ankle, or foot.  Ganglion cysts often go away on their own without treatment. This information is not intended to replace advice given to you by your health care provider. Make sure you discuss any questions you have with your health care provider. Document Released: 06/23/2000 Document Revised: 06/08/2017 Document Reviewed: 02/23/2017 Elsevier Patient Education  2020 [employer].

## 2019-05-30 NOTE — Progress Notes (Signed)
Liver and kidney labs look normal. Uric acid is normal indicating no gout. No signs of infection or anemia. Rheumatoid factor is normal. Sedimentation rate a marker of inflammation is barely positive at 31.  The normal value is 30 or less.  This does not indicate significant disease. CK a marker of muscle inflammation is also barely positive and also does not indicate significant disease. Other labs are still pending.

## 2019-05-31 LAB — RHEUMATOID FACTOR: Rheumatoid fact SerPl-aCnc: <14 IU/mL (ref <14)

## 2019-05-31 LAB — COMPLETE METABOLIC PANEL WITH GFR
AG Ratio: 1.6 (calc) (ref 1.0–2.5)
ALT: 23 U/L (ref 6–29)
AST: 20 U/L (ref 10–35)
Albumin: 4.4 g/dL (ref 3.6–5.1)
Alkaline phosphatase (APISO): 75 U/L (ref 37–153)
BUN: 14 mg/dL (ref 7–25)
CO2: 29 mmol/L (ref 20–32)
Calcium: 10 mg/dL (ref 8.6–10.4)
Chloride: 105 mmol/L (ref 98–110)
Creat: 0.79 mg/dL (ref 0.50–1.05)
GFR, Est African American: 99 mL/min/{1.73_m2} (ref >=60)
GFR, Est Non African American: 85 mL/min/{1.73_m2} (ref >=60)
Globulin: 2.8 g/dL (calc) (ref 1.9–3.7)
Glucose, Bld: 94 mg/dL (ref 65–99)
Potassium: 4.7 mmol/L (ref 3.5–5.3)
Sodium: 141 mmol/L (ref 135–146)
Total Bilirubin: 0.3 mg/dL (ref 0.2–1.2)
Total Protein: 7.2 g/dL (ref 6.1–8.1)

## 2019-05-31 LAB — CYCLIC CITRUL PEPTIDE ANTIBODY, IGG: Cyclic Citrullin Peptide Ab: <16 UNITS

## 2019-05-31 LAB — ANA: Anti Nuclear Antibody (ANA): NEGATIVE

## 2019-06-02 NOTE — Progress Notes (Signed)
Remaining rheumatology labs negative.

## 2019-07-02 ENCOUNTER — Ambulatory Visit: Payer: BC Managed Care – PPO | Admitting: Family Medicine

## 2019-08-04 ENCOUNTER — Encounter (INDEPENDENT_AMBULATORY_CARE_PROVIDER_SITE_OTHER): Payer: Self-pay | Admitting: Bariatrics

## 2019-08-04 ENCOUNTER — Ambulatory Visit (INDEPENDENT_AMBULATORY_CARE_PROVIDER_SITE_OTHER): Payer: BC Managed Care – PPO | Admitting: Bariatrics

## 2019-08-04 ENCOUNTER — Other Ambulatory Visit: Payer: Self-pay

## 2019-08-04 VITALS — BP 150/83 | HR 81 | Temp 98.6°F | Ht 67.0 in | Wt 229.0 lb

## 2019-08-04 DIAGNOSIS — E7849 Other hyperlipidemia: Secondary | ICD-10-CM | POA: Diagnosis not present

## 2019-08-04 DIAGNOSIS — R7309 Other abnormal glucose: Secondary | ICD-10-CM | POA: Diagnosis not present

## 2019-08-04 DIAGNOSIS — Z1331 Encounter for screening for depression: Secondary | ICD-10-CM

## 2019-08-04 DIAGNOSIS — I10 Essential (primary) hypertension: Secondary | ICD-10-CM | POA: Diagnosis not present

## 2019-08-04 DIAGNOSIS — Z6836 Body mass index (BMI) 36.0-36.9, adult: Secondary | ICD-10-CM

## 2019-08-04 DIAGNOSIS — Z9189 Other specified personal risk factors, not elsewhere classified: Secondary | ICD-10-CM

## 2019-08-04 DIAGNOSIS — R5383 Other fatigue: Secondary | ICD-10-CM

## 2019-08-04 DIAGNOSIS — R0602 Shortness of breath: Secondary | ICD-10-CM | POA: Diagnosis not present

## 2019-08-04 DIAGNOSIS — Z0289 Encounter for other administrative examinations: Secondary | ICD-10-CM

## 2019-08-04 NOTE — Progress Notes (Signed)
Dear [name] [name],   Thank you for referring [name] [name] to our clinic. The following note includes my evaluation and treatment recommendations.  Chief Complaint:   OBESITY [name] [name] (MR# [id number]) is a 54 y.o. female who presents for evaluation and treatment of obesity and related comorbidities. Current BMI is Body mass index is 35.87 kg/m.Marland Kitchen [name] [name] has been struggling with her weight for many years and has been unsuccessful in either losing weight, maintaining weight loss, or reaching her healthy weight goal.  [name] [name] is currently in the action stage of change and ready to dedicate time achieving and maintaining a healthier weight. [name] [name] is interested in becoming our patient and working on intensive lifestyle modifications including (but not limited to) diet and exercise for weight loss.  [name] [name] does like to cook. She denies cravings. She does skip meals.  [name] [name]'s habits were reviewed today and are as follows: Her family eats meals together, she thinks her family will eat healthier with her, her desired weight loss is 29 lbs, her heaviest weight ever was 229 pounds and she skips meals frequently.  Depression Screen: [name] [name] (modified PHQ-9) score was 3.  Depression screen [name] [name] 2/9 08/04/2019  Decreased Interest 0  Down, Depressed, Hopeless 0  PHQ - 2 Score 0  Altered sleeping 0  Tired, decreased energy 1  Change in appetite 0  Feeling bad or failure about yourself  0  Trouble concentrating 1  Moving slowly or fidgety/restless 1  Suicidal thoughts 0  PHQ-9 Score 3  Difficult doing work/chores Not difficult at all   Subjective:   Other fatigue. [name] denies daytime somnolence and denies waking up still tired. [name] [name] generally gets 8 hours of sleep per night, and states that she has generally restful sleep. Snoring is present. Apneic episodes are not present. Epworth Sleepiness Score is 6.  Shortness of breath on exertion. [name] [name] notes  increasing shortness of breath with certain activities and seems to be worsening over time with weight gain. She notes getting out of breath sooner with activity than she used to. This has gotten worse recently. [name] [name] denies shortness of breath at rest or orthopnea.  Other hyperlipidemia. Medication(s) reviewed. Patient denies myalgias. She is taking Fish Oil.   Lab Results  Component Value Date   CHOL 191 08/05/2018   HDL 46.00 08/05/2018   LDLCALC 114 (H) 08/05/2018   TRIG 154.0 (H) 08/05/2018   CHOLHDL 4 08/05/2018   Lab Results  Component Value Date   ALT 23 05/29/2019   AST 20 05/29/2019   ALKPHOS 71 08/05/2018   BILITOT 0.3 05/29/2019   The 10-year ASCVD risk score Denman George DC Jr., et al., 2013) is: 8.9%   Values used to calculate the score:     Age: 77 years     Sex: Female     Is Non-Hispanic African American: Yes     Diabetic: No     Tobacco smoker: No     Systolic Blood Pressure: 150 mmHg     Is BP treated: Yes     HDL Cholesterol: 46 mg/dL     Total Cholesterol: 191 mg/dL  Essential hypertension. [name] [name] is taking Norvasc. This is reasonably well controlled.  BP Readings from Last 3 Encounters:  08/04/19 (!) 150/83  05/29/19 140/84  05/27/19 130/78   Lab Results  Component Value Date   CREATININE 0.79 05/29/2019   CREATININE 0.86 08/05/2018   CREATININE 0.8 04/21/2014   Elevated glucose. No polyphagia. She reports having  a decreased appetite.  At risk for heart disease. [name] [name] is at a higher than average risk for cardiovascular disease due to hyperlipidemia and obesity. Reviewed: no chest pain on exertion, no dyspnea on exertion, and no swelling of ankles.  Depression screening. [name] [name] had a negative depression screening today with a PHQ-9 score of 3.  Assessment/Plan:   Other fatigue. [name] [name] does feel that her weight is causing her energy to be lower than it should be. Fatigue may be related to obesity, depression or many other causes. Labs will be  ordered, and in the meanwhile, [name] [name] will focus on self care including making healthy food choices, increasing physical activity and focusing on stress reduction. EKG 12-Lead, T3, T4, free, TSH, VITAMIN D 25 Hydroxy (Vit-D Deficiency, Fractures) ordered.  Shortness of breath on exertion. [name] [name] does feel that she gets out of breath more easily that she used to when she exercises. [name] [name]'s shortness of breath appears to be obesity related and exercise induced. She has agreed to work on weight loss and gradually increase exercise to treat her exercise induced shortness of breath. Will continue to monitor closely.  Other hyperlipidemia. Cardiovascular risk and specific lipid/LDL goals reviewed.  We discussed several lifestyle modifications today and [name] [name] will continue to work on diet, exercise and weight loss efforts. Orders and follow up as documented in patient record. She will decrease saturated fats, increase PUFA's and MUFA's, and continue taking Fish Oil. Lipid Panel With LDL/HDL Ratio ordered.  Counseling Intensive lifestyle modifications are the first line treatment for this issue. . Dietary changes: Increase soluble fiber. Decrease simple carbohydrates. . Exercise changes: Moderate to vigorous-intensity aerobic activity 150 minutes per week if tolerated. Lipid-lowering medications: see documented in medical record.   Essential hypertension. [name] [name] is working on healthy weight loss and exercise to improve blood pressure control. We will watch for signs of hypotension as she continues her lifestyle modifications. She will continue her medications as prescribed. She will use no added salt in her diet.  Elevated glucose. Hemoglobin A1c, Insulin, random levels ordered.  At risk for heart disease. [name] [name] was given approximately 15 minutes of coronary artery disease prevention counseling today. She is 54 y.o. female and has risk factors for heart disease including obesity. We discussed intensive  lifestyle modifications today with an emphasis on specific weight loss instructions and strategies.   Repetitive spaced learning was employed today to elicit superior memory formation and behavioral change.  Depression screening. [name] [name] had a negative depression screening today with a score of 3.  Class 2 severe obesity with serious comorbidity and body mass index (BMI) of 36.0 to 36.9 in adult, unspecified obesity type (HCC).  [name] [name] is currently in the action stage of change and her goal is to continue with weight loss efforts. I recommend [name] [name] begin the structured treatment plan as follows:  She has agreed to the Category 3 Plan.  She will work on meal planning and decreasing unhealthy snacks.  We reviewed her labs including CMP, CBC, and glucose.  Exercise goals: For substantial health benefits, adults should do at least 150 minutes (2 hours and 30 minutes) a week of moderate-intensity, or 75 minutes (1 hour and 15 minutes) a week of vigorous-intensity aerobic physical activity, or an equivalent combination of moderate- and vigorous-intensity aerobic activity. Aerobic activity should be performed in episodes of at least 10 minutes, and preferably, it should be spread throughout the week. Adults should also include muscle-strengthening activities that involve all major muscle groups on 2 or more  days a week.   Behavioral modification strategies: increasing lean protein intake, decreasing simple carbohydrates, increasing vegetables, increasing water intake, decreasing eating out, no skipping meals, meal planning and cooking strategies, keeping healthy foods in the home and planning for success.  She was informed of the importance of frequent follow-up visits to maximize her success with intensive lifestyle modifications for her multiple health conditions. She was informed we would discuss her lab results at her next visit unless there is a critical issue that needs to be addressed sooner.  [name] [name] agreed to keep her next visit at the agreed upon time to discuss these results.  Objective:   Blood pressure (!) 150/83, pulse 81, temperature 98.6 F (37 C), height 5\' 7"  (1.702 m), weight 229 lb (103.9 kg), SpO2 98 %. Body mass index is 35.87 kg/m.  EKG: Normal sinus rhythm with a rate of 73 BPM. Within normal limits.  Indirect Calorimeter completed today shows a VO2 of 266 and a REE of 1850.  Her calculated basal metabolic rate is 1025 thus her basal metabolic rate is better than expected.  General: Cooperative, alert, well developed, in no acute distress. HEENT: Conjunctivae and lids unremarkable. Cardiovascular: Regular rhythm.  Lungs: Normal work of breathing. Neurologic: No focal deficits.   Lab Results  Component Value Date   CREATININE 0.79 05/29/2019   BUN 14 05/29/2019   NA 141 05/29/2019   K 4.7 05/29/2019   CL 105 05/29/2019   CO2 29 05/29/2019   Lab Results  Component Value Date   ALT 23 05/29/2019   AST 20 05/29/2019   ALKPHOS 71 08/05/2018   BILITOT 0.3 05/29/2019   No results found for: HGBA1C No results found for: INSULIN Lab Results  Component Value Date   TSH 1.46 04/21/2014   Lab Results  Component Value Date   CHOL 191 08/05/2018   HDL 46.00 08/05/2018   LDLCALC 114 (H) 08/05/2018   TRIG 154.0 (H) 08/05/2018   CHOLHDL 4 08/05/2018   Lab Results  Component Value Date   WBC 6.4 05/29/2019   HGB 12.6 05/29/2019   HCT 38.3 05/29/2019   MCV 95.4 05/29/2019   PLT 240.0 05/29/2019   No results found for: IRON, TIBC, FERRITIN  Attestation Statements:   Reviewed by clinician on day of visit: allergies, medications, problem list, medical history, surgical history, family history, social history, and previous encounter notes.  Migdalia Dk, am acting as Location manager for [employer], DO   I have reviewed the above documentation for accuracy and completeness, and I agree with the above. Jearld Lesch, DO

## 2019-08-05 LAB — INSULIN, RANDOM: INSULIN: 20.7 u[IU]/mL (ref 2.6–24.9)

## 2019-08-05 LAB — TSH: TSH: 1.62 u[IU]/mL (ref 0.450–4.500)

## 2019-08-05 LAB — LIPID PANEL WITH LDL/HDL RATIO
Cholesterol, Total: 191 mg/dL (ref 100–199)
HDL: 56 mg/dL (ref >39)
LDL Chol Calc (NIH): 119 mg/dL — ABNORMAL HIGH (ref 0–99)
LDL/HDL Ratio: 2.1 ratio (ref 0.0–3.2)
Triglycerides: 88 mg/dL (ref 0–149)
VLDL Cholesterol Cal: 16 mg/dL (ref 5–40)

## 2019-08-05 LAB — VITAMIN D 25 HYDROXY (VIT D DEFICIENCY, FRACTURES): Vit D, 25-Hydroxy: 13.1 ng/mL — ABNORMAL LOW (ref 30.0–100.0)

## 2019-08-05 LAB — T4, FREE: Free T4: 1.14 ng/dL (ref 0.82–1.77)

## 2019-08-05 LAB — HEMOGLOBIN A1C
Est. average glucose Bld gHb Est-mCnc: 108 mg/dL
Hgb A1c MFr Bld: 5.4 % (ref 4.8–5.6)

## 2019-08-05 LAB — T3: T3, Total: 116 ng/dL (ref 71–180)

## 2019-08-06 ENCOUNTER — Encounter (INDEPENDENT_AMBULATORY_CARE_PROVIDER_SITE_OTHER): Payer: Self-pay | Admitting: Bariatrics

## 2019-08-06 DIAGNOSIS — E559 Vitamin D deficiency, unspecified: Secondary | ICD-10-CM | POA: Insufficient documentation

## 2019-08-18 ENCOUNTER — Ambulatory Visit (INDEPENDENT_AMBULATORY_CARE_PROVIDER_SITE_OTHER): Payer: BLUE CROSS/BLUE SHIELD | Admitting: Bariatrics

## 2019-11-03 ENCOUNTER — Telehealth: Payer: Self-pay | Admitting: Family Medicine

## 2019-11-03 NOTE — Telephone Encounter (Signed)
Pt called stating she is getting her second COVID shot today at 11:30. Pt stated she also is getting a cap put on her tooth on Wednesday. Pt wanted to know if it was okay for her to get this procedure done after getting her COVID vaccine. Please advise.

## 2019-11-03 NOTE — Telephone Encounter (Signed)
Okay per the CDC site but would double check with dentist  [email or web address]

## 2019-11-03 NOTE — Telephone Encounter (Signed)
Please advise 

## 2019-11-03 NOTE — Telephone Encounter (Signed)
Called and gave pt below information.

## 2019-11-26 NOTE — Progress Notes (Deleted)
Phone: [phone]   Subjective:  Patient presents today for their annual physical. Chief complaint-noted.   See problem oriented charting- ROS- full  review of systems was completed and negative except for: ***  The following were reviewed and entered/updated in epic: Past Medical History:  Diagnosis Date  . Allergy   . Back pain   . Hyperlipidemia    not on meds trying diet and exercise  . Hypertension    not on meds  diet and exercise  . Joint pain   . Obesity    Patient Active Problem List   Diagnosis Date Noted  . Vitamin D deficiency 08/06/2019  . Right arm pain 06/08/2017  . Hypertension, essential 05/01/2017  . Hyperlipidemia 10/20/2014  . Obesity 10/20/2014   Past Surgical History:  Procedure Laterality Date  . ABDOMINAL HYSTERECTOMY     fibroids. Dr. Tollie Eth including cervix  . ABDOMINAL SURGERY     abdominoplasty- in late 30's   . CESAREAN SECTION     x2, 93 and 87  . CHOLECYSTECTOMY    . tummy tuck      Family History  Problem Relation Age of Onset  . Liver disease Father   . Drug abuse Father   . Heart disease Mother        in late 84s, nonsmoker  . Heart attack Mother   . Hypertension Mother   . Diabetes Mother   . Diabetes Sister   . Colon cancer Neg Hx   . Colon polyps Neg Hx   . Esophageal cancer Neg Hx   . Rectal cancer Neg Hx   . Stomach cancer Neg Hx   . Breast cancer Neg Hx     Medications- reviewed and updated Current Outpatient Medications  Medication Sig Dispense Refill  . amLODipine (NORVASC) 10 MG tablet Take 1 tablet (10 mg total) by mouth daily. 90 tablet 3  . aspirin EC 81 MG tablet Take 81 mg by mouth daily.    . Calcium Carbonate-Vit D-Min (CALCIUM 1200 PO) Take by mouth.    . Multiple Vitamin (MULTIVITAMIN) tablet Take 1 tablet by mouth daily.    . Omega-3 Fatty Acids (FISH OIL) 1000 MG CAPS Take by mouth.     No current facility-administered medications for this visit.    Allergies-reviewed and updated No  Known Allergies  Social History   Social History Narrative   remarried to [place] around 2017.  Divorced. 2 children. 1 grandchild Kae Heller born in 2016).       Works as Quarry manager at [employer]: ride motorcycle, reading, walking   Objective  Objective:  There were no vitals taken for this visit. Gen: NAD, resting comfortably HEENT: Mucous membranes are moist. Oropharynx normal Neck: no thyromegaly CV: RRR no murmurs rubs or gallops Lungs: CTAB no crackles, wheeze, rhonchi Abdomen: soft/nontender/nondistended/normal bowel sounds. No rebound or guarding.  Ext: no edema Skin: warm, dry Neuro: grossly normal, moves all extremities, PERRLA***   Assessment and Plan   #hypertension S: medication: Amlodpine 10mg , Aspirin 81mg ,  Home readings #s: *** BP Readings from Last 3 Encounters:  08/04/19 (!) 150/83  05/29/19 140/84  05/27/19 130/78  A/P: ***  #hyperlipidemia S: Medication: Fish Oil 1000mg   Lab Results  Component Value Date   CHOL 191 08/04/2019   HDL 56 08/04/2019   LDLCALC 119 (H) 08/04/2019   TRIG 88 08/04/2019   CHOLHDL 4 08/05/2018   A/P: ***  #Vitamin  D deficiency S: Medication: *** Last vitamin D Lab Results  Component Value Date   VD25OH 13.1 (L) 08/04/2019   A/P: ***   54 y.o. female presenting for annual physical.  Health Maintenance counseling: 1. Anticipatory guidance: Patient counseled regarding regular dental exams ***q6 months, eye exams ***,  avoiding smoking and second hand smoke*** , limiting alcohol to 1 beverage per day*** .   2. Risk factor reduction:  Advised patient of need for regular exercise and diet rich and fruits and vegetables to reduce risk of heart attack and stroke. Exercise- ***. Diet-***.  Wt Readings from Last 3 Encounters:  08/04/19 229 lb (103.9 kg)  05/29/19 235 lb 12.8 oz (107 kg)  05/27/19 234 lb 6.4 oz (106.3 kg)   3. Immunizations/screenings/ancillary studies  Immunization History  Administered Date(s) Administered  . Influenza,inj,Quad PF,6+ Mos 05/01/2017, 08/05/2018, 04/18/2019  . Tdap 04/21/2014   Health Maintenance Due  Topic Date Due  . HIV Screening  Never done  . COVID-19 Vaccine (1) Never done   4. Cervical cancer screening- *** 5. Breast cancer screening-  breast exam *** and mammogram *** 6. Colon cancer screening - *** 7. Skin cancer screening- ***advised regular sunscreen use. Denies worrisome, changing, or new skin lesions.  8. Birth control/STD check- *** 9. Osteoporosis screening at 60- *** -*** smoker  Status of chronic or acute concerns   No specialty comments available. *** No diagnosis found.  Recommended follow up: ***No follow-ups on file. Future Appointments  Date Time Provider Department Center  11/27/2019 10:40 AM Shelva Majestic, MD [facility]    No chief complaint on file.  Lab/Order associations:*** fasting No diagnosis found.  No orders of the defined types were placed in this encounter.   Return precautions advised.  Lieutenant Diego, CMA

## 2019-11-27 ENCOUNTER — Encounter: Payer: BC Managed Care – PPO | Admitting: Family Medicine

## 2020-02-24 ENCOUNTER — Other Ambulatory Visit: Payer: Self-pay | Admitting: Family Medicine

## 2020-02-24 DIAGNOSIS — Z1231 Encounter for screening mammogram for malignant neoplasm of breast: Secondary | ICD-10-CM

## 2020-03-18 ENCOUNTER — Other Ambulatory Visit: Payer: Self-pay

## 2020-03-18 ENCOUNTER — Ambulatory Visit
Admission: RE | Admit: 2020-03-18 | Discharge: 2020-03-18 | Disposition: A | Discharge status: Home / Self Care | Payer: BC Managed Care – PPO | Source: Ambulatory Visit

## 2020-03-18 ENCOUNTER — Ambulatory Visit (INDEPENDENT_AMBULATORY_CARE_PROVIDER_SITE_OTHER): Payer: BC Managed Care – PPO | Admitting: Obstetrics & Gynecology

## 2020-03-18 ENCOUNTER — Encounter: Payer: Self-pay | Admitting: Obstetrics & Gynecology

## 2020-03-18 VITALS — BP 136/88 | Ht 67.0 in | Wt 235.0 lb

## 2020-03-18 DIAGNOSIS — Z1231 Encounter for screening mammogram for malignant neoplasm of breast: Secondary | ICD-10-CM | POA: Diagnosis not present

## 2020-03-18 DIAGNOSIS — Z1272 Encounter for screening for malignant neoplasm of vagina: Secondary | ICD-10-CM

## 2020-03-18 DIAGNOSIS — Z780 Asymptomatic menopausal state: Secondary | ICD-10-CM

## 2020-03-18 DIAGNOSIS — Z90710 Acquired absence of both cervix and uterus: Secondary | ICD-10-CM

## 2020-03-18 DIAGNOSIS — Z6836 Body mass index (BMI) 36.0-36.9, adult: Secondary | ICD-10-CM

## 2020-03-18 DIAGNOSIS — Z01419 Encounter for gynecological examination (general) (routine) without abnormal findings: Secondary | ICD-10-CM | POA: Diagnosis not present

## 2020-03-18 NOTE — Progress Notes (Signed)
[name] [name] [facility] [birth year] [id number]   History:    54 y.o. G4P2A2L2 Married.  RP:  Established patient presenting for annual gyn exam   HPI:S/P Total Hysterectomy. No pelvic pain. No pain with IC. No menopausal Sx. Breasts wnl. Urine/BMs wnl. Obesity, BMI 36.81.  Not physically active.Difficulty loosing weight. HTN followed by Dr Durene Cal. Health labs with Dr Durene Cal. Colonoscopy 07/2017.   Past medical history,surgical history, family history and social history were all reviewed and documented in the EPIC chart.  Gynecologic History No LMP recorded. Patient has had a hysterectomy.  Obstetric History OB History  Gravida Para Term Preterm AB Living  4 2     2 2   SAB TAB Ectopic Multiple Live Births               # Outcome Date GA Lbr Len/2nd Weight Sex Delivery Anes PTL Lv  4 AB           3 AB           2 Para           1 Para              ROS: A ROS was performed and pertinent positives and negatives are included in the history.  GENERAL: No fevers or chills. HEENT: No change in vision, no earache, sore throat or sinus congestion. NECK: No pain or stiffness. CARDIOVASCULAR: No chest pain or pressure. No palpitations. PULMONARY: No shortness of breath, cough or wheeze. GASTROINTESTINAL: No abdominal pain, nausea, vomiting or diarrhea, melena or bright red blood per rectum. GENITOURINARY: No urinary frequency, urgency, hesitancy or dysuria. MUSCULOSKELETAL: No joint or muscle pain, no back pain, no recent trauma. DERMATOLOGIC: No rash, no itching, no lesions. ENDOCRINE: No polyuria, polydipsia, no heat or cold intolerance. No recent change in weight. HEMATOLOGICAL: No anemia or easy bruising or bleeding. NEUROLOGIC: No headache, seizures, numbness, tingling or weakness. PSYCHIATRIC: No depression, no loss of interest in normal activity or change in sleep pattern.     Exam:   BP 136/88    Ht 5\' 7"  (1.702 m)    Wt 235 lb (106.6 kg)    BMI 36.81 kg/m   Body mass  index is 36.81 kg/m.  General appearance : Well developed well nourished female. No acute distress HEENT: Eyes: no retinal hemorrhage or exudates,  Neck supple, trachea midline, no carotid bruits, no thyroidmegaly Lungs: Clear to auscultation, no rhonchi or wheezes, or rib retractions  Heart: Regular rate and rhythm, no murmurs or gallops Breast:Examined in sitting and supine position were symmetrical in appearance, no palpable masses or tenderness,  no skin retraction, no nipple inversion, no nipple discharge, no skin discoloration, no axillary or supraclavicular lymphadenopathy Abdomen: no palpable masses or tenderness, no rebound or guarding Extremities: no edema or skin discoloration or tenderness  Pelvic: Vulva: Normal             Vagina: No gross lesions or discharge  Cervix/Uterus absent  Adnexa  Without masses or tenderness  Anus: Normal   Assessment/Plan:  54 y.o. female for annual exam   1. Encounter for Papanicolaou smear of vagina as part of routine gynecological examination Gynecologic exam status post total hysterectomy.  Pap test done at the vaginal vault.  Breast exam normal.  Screening mammogram done today at the breast center, report pending.  Colonoscopy 2019.  Health labs with family physician.  2. Hx of total hysterectomy  3. Postmenopause Well on no hormone replacement therapy.  Vitamin D supplements, calcium intake of 1500 mg daily and regular weightbearing physical activity is recommended.  4. Class 2 severe obesity due to excess calories with serious comorbidity and body mass index (BMI) of 36.0 to 36.9 in adult [facility]) Recommend a lower calorie/carb diet such as [employer].  Intermittent fasting discussed with patient.  Recommend aerobic activities 5 times a week and light weightlifting every 2 days.  Genia Del MD, 2:25 PM 03/18/2020

## 2020-03-18 NOTE — Addendum Note (Signed)
Addended by: Berna Spare A on: 03/18/2020 03:30 PM   Modules accepted: Orders

## 2020-03-22 ENCOUNTER — Telehealth: Payer: Self-pay | Admitting: Family Medicine

## 2020-03-22 LAB — PAP IG W/ RFLX HPV ASCU

## 2020-03-22 NOTE — Telephone Encounter (Signed)
Can send to sports med- need location of pain and can use that as source for referral

## 2020-03-22 NOTE — Telephone Encounter (Signed)
Ok to send straight to sports med?

## 2020-03-22 NOTE — Telephone Encounter (Signed)
Patient like to know if she needs to see a specialist for her bone spur or if she needs to come see you first. And if she does need to see specialist and if dr Denyse Amass can be her provider for this case.please advise.

## 2020-03-23 DIAGNOSIS — R2232 Localized swelling, mass and lump, left upper limb: Secondary | ICD-10-CM

## 2020-03-23 NOTE — Telephone Encounter (Signed)
Patient was sent a mychart message asking her the location of her pain

## 2020-03-24 ENCOUNTER — Other Ambulatory Visit: Payer: Self-pay

## 2020-03-24 ENCOUNTER — Ambulatory Visit: Payer: BC Managed Care – PPO | Admitting: Family Medicine

## 2020-03-24 DIAGNOSIS — M79671 Pain in right foot: Secondary | ICD-10-CM

## 2020-03-24 NOTE — Progress Notes (Signed)
ambu

## 2020-03-30 ENCOUNTER — Ambulatory Visit: Payer: Self-pay

## 2020-03-30 ENCOUNTER — Ambulatory Visit: Payer: BC Managed Care – PPO | Admitting: Family Medicine

## 2020-03-30 ENCOUNTER — Other Ambulatory Visit: Payer: Self-pay

## 2020-03-30 ENCOUNTER — Encounter: Payer: Self-pay | Admitting: Family Medicine

## 2020-03-30 VITALS — BP 120/74 | HR 86 | Ht 67.0 in | Wt 235.2 lb

## 2020-03-30 DIAGNOSIS — M79671 Pain in right foot: Secondary | ICD-10-CM | POA: Diagnosis not present

## 2020-03-30 DIAGNOSIS — M79645 Pain in left finger(s): Secondary | ICD-10-CM

## 2020-03-30 DIAGNOSIS — R2232 Localized swelling, mass and lump, left upper limb: Secondary | ICD-10-CM

## 2020-03-30 LAB — CK: Total CK: 140 U/L (ref 29–143)

## 2020-03-30 LAB — SEDIMENTATION RATE: Sed Rate: 6 mm/h (ref 0–30)

## 2020-03-30 NOTE — Patient Instructions (Signed)
Thank you for coming in today.  Please get labs today before you leave  Do the ice massage and the heel exercises go down slowly.  Gel hell cushions.   Please use voltaren gel up to 4x daily for pain as needed.   If labs are normal will refer to hand surgery for 2nd opinion and possible removal.   Keep me updated.

## 2020-03-30 NOTE — Progress Notes (Signed)
I, Christoper Fabian, LAT, ATC, am serving as scribe for Dr. Clementeen Graham.  [name] [name] is a 54 y.o. female who presents to [facility] at [facility] today for R heel and L hand pain.  She was last seen by Dr. Denyse Amass on 05/29/19 for her R elbow and L middle finger and had a R wrist injection.  She was also referred for hand therapy.  Since her last visit, pt reports that she has 2 nodules on her L dorsal 3rd finger at the PIP that burn and are getting bigger in size.  Her R heel pain began a few months ago.  She locates her pain to her plantar heel and will sometimes radiate into her R plantar fascia .  Her pain is worse when first standing after being in a resting position.  She has tried ice, stretching, multiple sets of new running/walking shoes.  She is also c/o L lateral foot pain x a few days.  Pertinent review of systems: No fevers or chills  Relevant historical information: Hyperlipidemia hypertension vitamin D deficiency. No history of rheumatologic disease.   Exam:  BP 120/74 (BP Location: Left Arm, Patient Position: Sitting, Cuff Size: Large)   Pulse 86   Ht 5\' 7"  (1.702 m)   Wt 235 lb 3.2 oz (106.7 kg)   SpO2 98%   BMI 36.84 kg/m  General: Well Developed, well nourished, and in no acute distress.   MSK: Left hand third digit dorsal PIP 2 small fleshy nodules. Nonerythematous not particularly tender to palpation. Normal hand motion and strength. Sensation intact distally as is capillary refill.  Right foot normal-appearing Normal foot and ankle motion. Tender palpation plantar calcaneus Normal strength. Pulses cap refill and sensation are intact distally.  Left foot normal-appearing normal motion normal strength stable ligamentous exam not particular tender to palpation.    Lab and Radiology Results  Diagnostic Limited MSK Ultrasound of: Right heel and left finger  Right calcaneus. Plantar fascia visible at insertion on the calcaneus. Just distal  to plantar fascial insertion area of hyperechoic change just superficial to the bony cortex consistent with mild edema at insertion site. Consistent with plantar fasciitis  Left third digit dorsal nodule. Isoechoic nodules visible not associated with the extensor tendon. No hypoechoic change. No increased vascular activity. Unclear etiology  Impression: Plantar fasciitis and solid nodule left finger      Assessment and Plan: 54 y.o. female with right heel pain due to plantar fasciitis. Plan to treat with eccentric exercises heel cushion and ice. Noted that this will take some time to get better. Recheck back as needed.  Nodule left hand: Unclear etiology. Doubtful for Groton's papule due to dermatomyositis however this is not impossible. Plan for limited work-up with sed rate and CK. More extensive rheumatologic work-up was negative back in November 2020. Assuming rheumatologic work-up is negative I am not sure what the diagnosis is. The appearance of this is consistent with callus but the location is not consistent. As this is bothering her I think if rheumatologic work-up is negative neck step is probably referral to hand surgery for potential surgical excision/biopsy/injection.  Left lateral foot pain: Likely secondary to overuse as she is trying to offload her right foot. So early its hard to tell exactly what is wrong yet. Recommend some Voltaren gel and hopefully when she starts walking a little more normally with her right foot the left. Hurting as much. Can proceed with further work-up for this if it  continues to worsen. Right now exam is nondiagnostic as it still early and benign.  Orders Placed This Encounter  Procedures  . [place] LIMITED JOINT SPACE STRUCTURES LOW RIGHT(NO LINKED CHARGES)    Order Specific Question:   Reason for Exam (SYMPTOM  OR DIAGNOSIS REQUIRED)    Answer:   R heel pain    Order Specific Question:   Preferred imaging location?    Answer:   [facility]  . CK    Standing Status:   Future    Number of Occurrences:   1    Standing Expiration Date:   03/30/2021  . Sedimentation rate    Standing Status:   Future    Number of Occurrences:   1    Standing Expiration Date:   03/30/2021   No orders of the defined types were placed in this encounter.    Discussed warning signs or symptoms. Please see discharge instructions. Patient expresses understanding.   The above documentation has been reviewed and is accurate and complete Clementeen Graham, M.D.

## 2020-03-31 NOTE — Progress Notes (Signed)
Labs look normal. The nodule on your finger almost certainly do not represent dermatomyositis.

## 2020-04-19 ENCOUNTER — Telehealth: Payer: Self-pay | Admitting: Family Medicine

## 2020-04-19 NOTE — Telephone Encounter (Signed)
I am sure you are okay with this referral, but when looking into it I dont ever remember receiving this referral. It shows you ordered it but no name in the referrals tab also it is not showing my name as of authorizing it? Was showing a dixon?   Are you wanting this referral if so ill get on to sending it to Dr. Carlos Levering office.

## 2020-04-19 NOTE — Telephone Encounter (Signed)
Pt calling to check on her referral to [facility] specialist. I am unable to tell if the referral was sent so I did not ask her to call his office just yet.

## 2020-04-20 NOTE — Telephone Encounter (Signed)
I have sent the referral to Dr. Brunetta Genera office. Dr. Denyse Amass and I are very unsure of what happened when he placed the referral but it didn't go into the workque. I have sent the referral through [facility] and they should call her but if she wants to go ahead and call them and let them know that I sent it through [facility] today their number is [phone] to schedule.

## 2020-04-20 NOTE — Telephone Encounter (Signed)
Not sure what happened.  Please send referral to Dr. Carlos Levering office

## 2020-04-20 NOTE — Telephone Encounter (Signed)
Called pt, given referral practice info and phone # to contact them for scheduling.

## 2020-04-27 NOTE — Progress Notes (Deleted)
Phone [phone]   Subjective:  Patient presents today for their annual physical. Chief complaint-noted.   See problem oriented charting- ROS- full  review of systems was completed and negative except for: ***  The following were reviewed and entered/updated in epic: Past Medical History:  Diagnosis Date  . Allergy   . Back pain   . Hyperlipidemia    not on meds trying diet and exercise  . Hypertension    not on meds  diet and exercise  . Joint pain   . Obesity    Patient Active Problem List   Diagnosis Date Noted  . Vitamin D deficiency 08/06/2019  . Right arm pain 06/08/2017  . Hypertension, essential 05/01/2017  . Hyperlipidemia 10/20/2014  . Obesity 10/20/2014   Past Surgical History:  Procedure Laterality Date  . ABDOMINAL HYSTERECTOMY     fibroids. Dr. Faythe Casa including cervix  . ABDOMINAL SURGERY     abdominoplasty- in late 30's   . CESAREAN SECTION     x2, 89 and 87  . CHOLECYSTECTOMY    . tummy tuck      Family History  Problem Relation Age of Onset  . Liver disease Father   . Drug abuse Father   . Heart disease Mother        in late 23s, nonsmoker  . Heart attack Mother   . Hypertension Mother   . Diabetes Mother   . Diabetes Sister   . Colon cancer Neg Hx   . Colon polyps Neg Hx   . Esophageal cancer Neg Hx   . Rectal cancer Neg Hx   . Stomach cancer Neg Hx   . Breast cancer Neg Hx     Medications- reviewed and updated Current Outpatient Medications  Medication Sig Dispense Refill  . amLODipine (NORVASC) 10 MG tablet Take 1 tablet (10 mg total) by mouth daily. 90 tablet 3   No current facility-administered medications for this visit.    Allergies-reviewed and updated No Known Allergies  Social History   Social History Narrative   remarried to [place] around 2017.  Divorced. 2 children. 1 grandchild Fredricka Bonine born in 2016).       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 Gen: NAD, resting comfortably HEENT: Mucous membranes are moist. Oropharynx normal Neck: no thyromegaly CV: RRR no murmurs rubs or gallops Lungs: CTAB no crackles, wheeze, rhonchi Abdomen: soft/nontender/nondistended/normal bowel sounds. No rebound or guarding.  Ext: no edema Skin: warm, dry Neuro: grossly normal, moves all extremities, PERRLA***   Assessment and Plan   54 y.o. female presenting for annual physical.  Health Maintenance counseling: 1. Anticipatory guidance: Patient counseled regarding regular dental exams ***q6 months, eye exams ***,  avoiding smoking and second hand smoke*** , limiting alcohol to 1 beverage per day*** .   2. Risk factor reduction:  Advised patient of need for regular exercise and diet rich and fruits and vegetables to reduce risk of heart attack and stroke. Exercise- ***. Diet-***.  Wt Readings from Last 3 Encounters:  03/30/20 235 lb 3.2 oz (106.7 kg)  03/18/20 235 lb (106.6 kg)  08/04/19 229 lb (103.9 kg)   3. Immunizations/screenings/ancillary studies Immunization History  Administered Date(s) Administered  . Influenza,inj,Quad PF,6+ Mos 05/01/2017, 08/05/2018, 04/18/2019  . PFIZER SARS-COV-2 Vaccination 10/09/2019, 11/03/2019  . Tdap 04/21/2014   Health Maintenance  Due  Topic Date Due  . Hepatitis C Screening  Never done  . HIV Screening  Never done  . INFLUENZA VACCINE  02/08/2020   4. Cervical cancer screening- *** 5. Breast cancer screening-  breast exam *** and mammogram *** 6. Colon cancer screening - *** 7. Skin cancer screening- ***advised regular sunscreen use. Denies worrisome, changing, or new skin lesions.  8. Birth control/STD check- *** 9. Osteoporosis screening at 7- *** -*** smoker  Status of chronic or acute concerns  #hypertension S: medication: Amlodipine 10Mg  Home readings #s: *** BP Readings from Last 3 Encounters:  03/30/20  120/74  03/18/20 136/88  08/04/19 (!) 150/83  A/P: ***  #hyperlipidemia S: Medication:***  Lab Results  Component Value Date   CHOL 191 08/04/2019   HDL 56 08/04/2019   LDLCALC 119 (H) 08/04/2019   TRIG 88 08/04/2019   CHOLHDL 4 08/05/2018   A/P: ***  #Vitamin D deficiency S: Medication: *** Last vitamin D Lab Results  Component Value Date   VD25OH 13.1 (L) 08/04/2019   A/P: ***   No specialty comments available. *** No diagnosis found.  Recommended follow up: ***No follow-ups on file. Future Appointments  Date Time Provider Department Center  05/03/2020  9:20 AM 05/05/2020, MD [facility]  03/24/2021  8:00 AM 03/26/2021, MD [facility] Genia Del    No chief complaint on file.  Lab/Order associations:*** fasting No diagnosis found.  No orders of the defined types were placed in this encounter.   Return precautions advised.  Oneida Arenas, CMA

## 2020-04-30 ENCOUNTER — Encounter: Payer: BC Managed Care – PPO | Admitting: Family Medicine

## 2020-05-03 ENCOUNTER — Encounter: Payer: BC Managed Care – PPO | Admitting: Family Medicine

## 2020-05-03 DIAGNOSIS — Z114 Encounter for screening for human immunodeficiency virus [HIV]: Secondary | ICD-10-CM

## 2020-05-03 DIAGNOSIS — E559 Vitamin D deficiency, unspecified: Secondary | ICD-10-CM

## 2020-05-03 DIAGNOSIS — E785 Hyperlipidemia, unspecified: Secondary | ICD-10-CM

## 2020-05-03 DIAGNOSIS — Z0000 Encounter for general adult medical examination without abnormal findings: Secondary | ICD-10-CM

## 2020-05-03 DIAGNOSIS — I10 Essential (primary) hypertension: Secondary | ICD-10-CM

## 2020-05-03 DIAGNOSIS — Z1159 Encounter for screening for other viral diseases: Secondary | ICD-10-CM

## 2020-05-13 DIAGNOSIS — R2232 Localized swelling, mass and lump, left upper limb: Secondary | ICD-10-CM | POA: Diagnosis not present

## 2020-05-19 ENCOUNTER — Telehealth: Payer: Self-pay

## 2020-05-19 NOTE — Telephone Encounter (Signed)
MEDICATION: amlodipine 10 MG  PHARMACY: [facility] [phone]  Comments:   **Let patient know to contact pharmacy at the end of the day to make sure medication is ready. **  ** Please notify patient to allow 48-72 hours to process**  **Encourage patient to contact the pharmacy for refills or they can request refills through [facility]**

## 2020-05-20 MED ORDER — AMLODIPINE BESYLATE 10 MG PO TABS
10.0000 mg | ORAL_TABLET | Freq: Every day | ORAL | 0 refills | Status: DC
Start: 2020-05-20 — End: 2020-05-21

## 2020-05-21 ENCOUNTER — Other Ambulatory Visit: Payer: Self-pay

## 2020-05-21 ENCOUNTER — Encounter: Payer: Self-pay | Admitting: Family Medicine

## 2020-05-21 MED ORDER — AMLODIPINE BESYLATE 10 MG PO TABS
10.0000 mg | ORAL_TABLET | Freq: Every day | ORAL | 0 refills | Status: DC
Start: 2020-05-21 — End: 2020-06-14

## 2020-06-02 DIAGNOSIS — M722 Plantar fascial fibromatosis: Secondary | ICD-10-CM | POA: Diagnosis not present

## 2020-06-13 NOTE — Patient Instructions (Addendum)
Please stop by lab before you go If you have mychart- we will send your results within 3 business days of [place] receiving them.  If you do not have mychart- we will call you about results within 5 business days of [place] receiving them.  *please note we are currently using [facility] which has a longer processing time than [place] typically so labs may not come back as quickly as in the past *please also note that you will see labs on mychart as soon as they post. I will later go in and write notes on them- will say "notes from Dr. Durene Cal"  Consider weight watchers or myfitnesspal app. In 6 months I want you to set a goal of at least 10-15 lbs off  Lets do at least 2 meals a day (3 if possible)- id make sure you are getting at least 1200 calories a day  Lets try to do 150 minutes exercise per week minimum. Consider FIA  Health Maintenance Due  Topic Date Due  . INFLUENZA VACCINE Declined in office flu shot. Schedule to receive her booster tomorrow.  Let us know if you change your mind and want the flu shot 02/08/2020   Take 22449 units vitamin D weekly for 24 weeks then take at least 1000 units a day to maintain the level

## 2020-06-13 NOTE — Progress Notes (Signed)
Phone [phone]   Subjective:  Patient presents today for their annual physical. Chief complaint-noted.   See problem oriented charting- ROS- full  review of systems was completed and negative except for: swelling last month but cut out potato chips and has resolved- loves her ruffles, hot flashes- some improvements recently  The following were reviewed and entered/updated in epic: Past Medical History:  Diagnosis Date  . Allergy   . Back pain   . Hyperlipidemia    not on meds trying diet and exercise  . Hypertension    not on meds  diet and exercise  . Joint pain   . Obesity    Patient Active Problem List   Diagnosis Date Noted  . Hypertension, essential 05/01/2017    Priority: Medium  . Vitamin D deficiency 08/06/2019  . Right arm pain 06/08/2017  . Hyperlipidemia 10/20/2014  . Obesity 10/20/2014   Past Surgical History:  Procedure Laterality Date  . ABDOMINAL HYSTERECTOMY     fibroids. Dr. Faythe Casa including cervix  . ABDOMINAL SURGERY     abdominoplasty- in late 30's   . CESAREAN SECTION     x2, 89 and 87  . CHOLECYSTECTOMY    . tummy tuck      Family History  Problem Relation Age of Onset  . Liver disease Father   . Drug abuse Father   . Heart disease Mother        in late 44s, nonsmoker  . Heart attack Mother   . Hypertension Mother   . Diabetes Mother   . Diabetes Sister   . Colon cancer Neg Hx   . Colon polyps Neg Hx   . Esophageal cancer Neg Hx   . Rectal cancer Neg Hx   . Stomach cancer Neg Hx   . Breast cancer Neg Hx     Medications- reviewed and updated Current Outpatient Medications  Medication Sig Dispense Refill  . amLODipine (NORVASC) 10 MG tablet Take 1 tablet (10 mg total) by mouth daily. 90 tablet 3  . Vitamin D, Ergocalciferol, (DRISDOL) 1.25 MG (50000 UNIT) CAPS capsule Take 1 capsule (50,000 Units total) by mouth every 7 (seven) days. 12 capsule 1   No current facility-administered medications for this visit.     Allergies-reviewed and updated No Known Allergies  Social History   Social History Narrative   remarried to [place] around 2017.  Divorced. 2 children. 1 grandchild Fredricka Bonine born in 2016).       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 Oropharynx normal Neck: no thyromegaly CV: RRR no murmurs rubs or gallops Lungs: CTAB no crackles, wheeze, rhonchi Abdomen: soft/nontender/nondistended/normal bowel sounds. No rebound or guarding.  Ext: no edema Skin: warm, dry Neuro: grossly normal, moves all extremities, PERRLA   Assessment and Plan   54 y.o. female presenting for annual physical.  Health Maintenance counseling: 1. Anticipatory guidance: Patient counseled regarding regular dental exams q6 months, eye exams,  avoiding smoking and second hand smoke, limiting alcohol to 1 beverage per day.   2. Risk factor reduction:  Advised patient of  need for regular exercise and diet rich and fruits and vegetables to reduce risk of heart attack and stroke. Exercise-  walking in the house some- had been walking out at the track but doesn't feel so dsafe. Diet-skips some meals- advised against. .  Wt Readings from Last 3 Encounters:  06/14/20 235 lb 6.4 oz (106.8 kg)  03/30/20 235 lb 3.2 oz (106.7 kg)  03/18/20 235 lb (106.6 kg)  3. Immunizations/screenings/ancillary studies- booster planned tomorrow for covid 19. Declines flu shot this year but will reconsider next year.  Immunization History  Administered Date(s) Administered  . Influenza,inj,Quad PF,6+ Mos 05/01/2017, 08/05/2018, 04/18/2019  . PFIZER SARS-COV-2 Vaccination 10/09/2019, 11/03/2019  . Tdap 04/21/2014  4.  Cervical cancer screening- Dr. Seymour Bars 03/18/20. S/p total hysterectomy as well for benign reasons for fibroids 5. Breast cancer screening-  breast exam with GYN and mammogram 03/18/20 6. Colon cancer screening - 07/23/2017 with 10 year repeat planned 7. Skin cancer screening- lower risk due to melanin content. advised regular sunscreen use. Denies worrisome, changing, or new skin lesions.  8. Birth control/STD check- hysterectomy 9. Osteoporosis screening at 53-  Will plan on this -Never smoker  Status of chronic or acute concerns   #hypertension S: medication: Amlodipine 10Mg  BP Readings from Last 3 Encounters:  06/14/20 132/68  03/30/20 120/74  03/18/20 136/88  A/P: Stable. Continue current medications.   #hyperlipidemia S: Medication:none  Lab Results  Component Value Date   CHOL 191 08/04/2019   HDL 56 08/04/2019   LDLCALC 119 (H) 08/04/2019   TRIG 88 08/04/2019   CHOLHDL 4 08/05/2018   A/P: 10 year ascvd risk only 4.8% based off last labs. This is under goal for statin- continue to focus on healthy eating/regular exercise. Recheck next visist  #Vitamin D deficiency S: Medication:  States was never told to take vitamin D Last vitamin D Lab Results  Component Value Date   VD25OH 13.1 (L) 08/04/2019   A/P: Vitamin D has been very low-treat with 50,000 units once a week for 24 weeks then start 1000 units a day-recheck next visit  #ganglion cyst- did not respond to injections- has procedure with Dr. 08/06/2019 for removal planned  Recommended follow up: Return in about 6 months (around 12/13/2020) for follow up- or sooner if needed. Future Appointments  Date Time Provider Department Center  03/24/2021  8:00 AM 03/26/2021, MD [facility]   Lab/Order associations: non fasting   ICD-10-CM   1. Preventative health care  Z00.00 COMPLETE METABOLIC PANEL WITH GFR    CBC with Differential/Platelet  2. Hypertension, essential  I10 COMPLETE METABOLIC PANEL WITH GFR    CBC with  Differential/Platelet  3. Hyperlipidemia, unspecified hyperlipidemia type  E78.5   4. Vitamin D deficiency  E55.9   5. Encounter for hepatitis C screening test for low risk patient  Z11.59   6. Encounter for screening for HIV  Z11.4     Meds ordered this encounter  Medications  . amLODipine (NORVASC) 10 MG tablet    Sig: Take 1 tablet (10 mg total) by mouth daily.    Dispense:  90 tablet    Refill:  3  . Vitamin D, Ergocalciferol, (DRISDOL) 1.25 MG (50000 UNIT) CAPS capsule    Sig: Take 1 capsule (50,000 Units total) by mouth every 7 (seven) days.    Dispense:  12 capsule    Refill:  1    Return precautions advised.  Genia Del, MD

## 2020-06-14 ENCOUNTER — Ambulatory Visit (INDEPENDENT_AMBULATORY_CARE_PROVIDER_SITE_OTHER): Payer: BC Managed Care – PPO | Admitting: Family Medicine

## 2020-06-14 ENCOUNTER — Other Ambulatory Visit: Payer: Self-pay

## 2020-06-14 ENCOUNTER — Encounter: Payer: Self-pay | Admitting: Family Medicine

## 2020-06-14 VITALS — BP 132/68 | HR 83 | Temp 98.0°F | Resp 18 | Ht 67.0 in | Wt 235.4 lb

## 2020-06-14 DIAGNOSIS — E785 Hyperlipidemia, unspecified: Secondary | ICD-10-CM

## 2020-06-14 DIAGNOSIS — I10 Essential (primary) hypertension: Secondary | ICD-10-CM

## 2020-06-14 DIAGNOSIS — E559 Vitamin D deficiency, unspecified: Secondary | ICD-10-CM | POA: Diagnosis not present

## 2020-06-14 DIAGNOSIS — Z114 Encounter for screening for human immunodeficiency virus [HIV]: Secondary | ICD-10-CM

## 2020-06-14 DIAGNOSIS — Z1159 Encounter for screening for other viral diseases: Secondary | ICD-10-CM

## 2020-06-14 DIAGNOSIS — Z0000 Encounter for general adult medical examination without abnormal findings: Secondary | ICD-10-CM

## 2020-06-14 MED ORDER — VITAMIN D (ERGOCALCIFEROL) 1.25 MG (50000 UNIT) PO CAPS: 50000.0000 [IU] | ORAL_CAPSULE | ORAL | 1 refills | Status: DC

## 2020-06-14 MED ORDER — AMLODIPINE BESYLATE 10 MG PO TABS
10.0000 mg | ORAL_TABLET | Freq: Every day | ORAL | 3 refills | Status: DC
Start: 2020-06-14 — End: 2021-07-18

## 2020-06-15 LAB — CBC WITH DIFFERENTIAL/PLATELET
Absolute Monocytes: 409 cells/uL (ref 200–950)
Basophils Absolute: 59 cells/uL (ref 0–200)
Basophils Relative: 0.9 %
Eosinophils Absolute: 218 cells/uL (ref 15–500)
Eosinophils Relative: 3.3 %
HCT: 38.3 % (ref 35.0–45.0)
Hemoglobin: 12.8 g/dL (ref 11.7–15.5)
Lymphs Abs: 3095 cells/uL (ref 850–3900)
MCH: 31.9 pg (ref 27.0–33.0)
MCHC: 33.4 g/dL (ref 32.0–36.0)
MCV: 95.5 fL (ref 80.0–100.0)
MPV: 12 fL (ref 7.5–12.5)
Monocytes Relative: 6.2 %
Neutro Abs: 2818 cells/uL (ref 1500–7800)
Neutrophils Relative %: 42.7 %
Platelets: 280 10*3/uL (ref 140–400)
RBC: 4.01 10*6/uL (ref 3.80–5.10)
RDW: 11.6 % (ref 11.0–15.0)
Total Lymphocyte: 46.9 %
WBC: 6.6 10*3/uL (ref 3.8–10.8)

## 2020-06-15 LAB — COMPLETE METABOLIC PANEL WITH GFR
AG Ratio: 1.7 (calc) (ref 1.0–2.5)
ALT: 28 U/L (ref 6–29)
AST: 24 U/L (ref 10–35)
Albumin: 4 g/dL (ref 3.6–5.1)
Alkaline phosphatase (APISO): 61 U/L (ref 37–153)
BUN: 11 mg/dL (ref 7–25)
CO2: 32 mmol/L (ref 20–32)
Calcium: 9.4 mg/dL (ref 8.6–10.4)
Chloride: 106 mmol/L (ref 98–110)
Creat: 0.71 mg/dL (ref 0.50–1.05)
GFR, Est African American: 112 mL/min/{1.73_m2} (ref >=60)
GFR, Est Non African American: 97 mL/min/{1.73_m2} (ref >=60)
Globulin: 2.4 g/dL (calc) (ref 1.9–3.7)
Glucose, Bld: 100 mg/dL — ABNORMAL HIGH (ref 65–99)
Potassium: 4.6 mmol/L (ref 3.5–5.3)
Sodium: 141 mmol/L (ref 135–146)
Total Bilirubin: 0.5 mg/dL (ref 0.2–1.2)
Total Protein: 6.4 g/dL (ref 6.1–8.1)

## 2020-06-17 DIAGNOSIS — D2112 Benign neoplasm of connective and other soft tissue of left upper limb, including shoulder: Secondary | ICD-10-CM | POA: Diagnosis not present

## 2020-06-18 DIAGNOSIS — R2232 Localized swelling, mass and lump, left upper limb: Secondary | ICD-10-CM | POA: Diagnosis not present

## 2020-08-26 ENCOUNTER — Telehealth: Payer: Self-pay

## 2020-08-26 NOTE — Telephone Encounter (Signed)
Pt states she ran out of her Vitamin B. Didn't see it on her medication list, so did not know what to attach

## 2020-08-26 NOTE — Telephone Encounter (Signed)
Spoke to pt told her I do not see Vit B12 on her med list, but is it OTC. Pt said it was Vit D she called the pharmacy and she has a refill and they are getting it ready for her. Told her okay.

## 2020-09-23 DIAGNOSIS — M722 Plantar fascial fibromatosis: Secondary | ICD-10-CM | POA: Diagnosis not present

## 2020-10-08 DIAGNOSIS — M722 Plantar fascial fibromatosis: Secondary | ICD-10-CM | POA: Diagnosis not present

## 2020-11-14 ENCOUNTER — Other Ambulatory Visit: Payer: Self-pay | Admitting: Family Medicine

## 2020-12-13 ENCOUNTER — Other Ambulatory Visit: Payer: Self-pay

## 2020-12-13 ENCOUNTER — Ambulatory Visit: Payer: BC Managed Care – PPO | Admitting: Family Medicine

## 2020-12-13 ENCOUNTER — Encounter: Payer: Self-pay | Admitting: Family Medicine

## 2020-12-13 VITALS — BP 146/84 | HR 73 | Temp 98.0°F | Resp 16 | Ht 67.0 in | Wt 235.4 lb

## 2020-12-13 DIAGNOSIS — I10 Essential (primary) hypertension: Secondary | ICD-10-CM

## 2020-12-13 DIAGNOSIS — E559 Vitamin D deficiency, unspecified: Secondary | ICD-10-CM

## 2020-12-13 DIAGNOSIS — E785 Hyperlipidemia, unspecified: Secondary | ICD-10-CM

## 2020-12-13 LAB — CBC WITH DIFFERENTIAL/PLATELET
Basophils Absolute: 0 10*3/uL (ref 0.0–0.1)
Basophils Relative: 0.8 % (ref 0.0–3.0)
Eosinophils Absolute: 0.2 10*3/uL (ref 0.0–0.7)
Eosinophils Relative: 4.1 % (ref 0.0–5.0)
HCT: 37.3 % (ref 36.0–46.0)
Hemoglobin: 12.4 g/dL (ref 12.0–15.0)
Lymphocytes Relative: 47.5 % — ABNORMAL HIGH (ref 12.0–46.0)
Lymphs Abs: 2.4 10*3/uL (ref 0.7–4.0)
MCHC: 33.4 g/dL (ref 30.0–36.0)
MCV: 93.6 fl (ref 78.0–100.0)
Monocytes Absolute: 0.3 10*3/uL (ref 0.1–1.0)
Monocytes Relative: 6.2 % (ref 3.0–12.0)
Neutro Abs: 2.1 10*3/uL (ref 1.4–7.7)
Neutrophils Relative %: 41.4 % — ABNORMAL LOW (ref 43.0–77.0)
Platelets: 242 10*3/uL (ref 150.0–400.0)
RBC: 3.98 Mil/uL (ref 3.87–5.11)
RDW: 12.9 % (ref 11.5–15.5)
WBC: 5.1 10*3/uL (ref 4.0–10.5)

## 2020-12-13 LAB — VITAMIN D 25 HYDROXY (VIT D DEFICIENCY, FRACTURES): VITD: 24.62 ng/mL — ABNORMAL LOW (ref 30.00–100.00)

## 2020-12-13 NOTE — Patient Instructions (Addendum)
Health Maintenance Due  Topic Date Due  . Zoster Vaccines- Shingrix (1 of 2) Never done  . COVID-19 Vaccine (3 - Booster for Pfizer series) 04/04/2020    Mild poor control for the first time in several visits. She agrees to monitor at home for 2 weeks and report to [place] results. Goal at home is less than 135/85 on average.  Consider using MyFitnessPal app  Please stop by lab before you go If you have mychart- we will send your results within 3 business days of [place] receiving them.  If you do not have mychart- we will call you about results within 5 business days of [place] receiving them.  *please also note that you will see labs on mychart as soon as they post. I will later go in and write notes on them- will say "notes from Dr. Durene Cal"  Recommended follow up: Return in about 6 months (around 06/14/2021) for physical or sooner if needed.

## 2020-12-13 NOTE — Progress Notes (Signed)
Phone [phone] In person visit   Subjective:   [name] [name] is a 55 y.o. year old very pleasant female patient who presents for/with See problem oriented charting Chief Complaint  Patient presents with  . Hyperlipidemia  . Hypertension  . vitamin d deficiency  . Leg Swelling    Bilateral, knows this is a side effect of Amlodipine   . Health Maintenance    Wants to discuss getting Shingrix vaccine, hx of chicken pox   This visit occurred during the SARS-CoV-2 public health emergency.  Safety protocols were in place, including screening questions prior to the visit, additional usage of staff PPE, and extensive cleaning of exam room while observing appropriate contact time as indicated for disinfecting solutions.   Past Medical History-  Patient Active Problem List   Diagnosis Date Noted  . Hypertension, essential 05/01/2017    Priority: Medium  . Hyperlipidemia 10/20/2014    Priority: Medium  . Vitamin D deficiency 08/06/2019  . Right arm pain 06/08/2017  . Obesity 10/20/2014    Medications- reviewed and updated Current Outpatient Medications  Medication Sig Dispense Refill  . amLODipine (NORVASC) 10 MG tablet Take 1 tablet (10 mg total) by mouth daily. 90 tablet 3  . Vitamin D, Ergocalciferol, (DRISDOL) 1.25 MG (50000 UNIT) CAPS capsule TAKE 1 CAPSULE (50,000 UNITS TOTAL) BY MOUTH EVERY 7 (SEVEN) DAYS 12 capsule 1   No current facility-administered medications for this visit.     Objective:  BP (!) 146/84   Pulse 73   Temp 98 F (36.7 C) (Temporal)   Resp 16   Ht 5\' 7"  (1.702 m)   Wt 235 lb 6.4 oz (106.8 kg)   SpO2 98%   BMI 36.87 kg/m  Gen: NAD, resting comfortably CV: RRR no murmurs rubs or gallops Lungs: CTAB no crackles, wheeze, rhonchi Ext: trace edema Skin: warm, dry     Assessment and Plan   #hyperlipidemia S:Medication: none  Lab Results  Component Value Date   CHOL 191 08/04/2019   HDL 56 08/04/2019   LDLCALC 119 (H) 08/04/2019    TRIG 88 08/04/2019   CHOLHDL 4 08/05/2018  A/P: Mild elevation of lipids in the past.  We will update lipid panel today.  ASCVD risk has been below 7.5%-we discussed if above this may need to consider medication  # Hypertension  S:Medication: Amlodipine 10 mg-No pain but have some mild swelling.  At home BP #: not checking at home Exercise and Diet: She brought a bike but has not used it yet secondary to her heel spur in the past. Plans to consider riding some time this week. Does not salt her foods- but she does have cranberry and vodka every night.  BP Readings from Last 3 Encounters:  12/13/20 (!) 146/84  06/14/20 132/68  03/30/20 120/74  A/P: Mild poor control for the first time in several visits. She agrees to monitor at home for 2 weeks and report to 04/01/20 results. Goal at home is less than 135/85 on average. Consider using MyFitnessPal app to help with weight loss and encouraged her to use her bike.  We discussed alcohol can certainly raise blood pressure readings  # Vitamin D deficiency  S:Medication: Taking Vitamin D 50,000 units on Thursdays A/P: hopefully stable- update vitamin D today. Continue current meds   #ganglion cyst-procedure went well and stable in regards to her finger  #Working through some orthopedic issues with the right lower leg with Ortho  #Declines Shingrix for now.  Discussed COVID-19 vaccination #3 patient states will consider  Recommended follow KB:TCYELY in about 6 months (around 06/14/2021) for physical or sooner if needed. Future Appointments  Date Time Provider Department Center  03/24/2021  8:00 AM Genia Del, MD [facility] None  03/24/2021  9:20 AM Durene Cal Aldine Contes, MD [facility]   Lab/Order associations:   ICD-10-CM   1. Hypertension, essential  I10   2. Hyperlipidemia, unspecified hyperlipidemia type  E78.5 CBC with Differential/Platelet    Comprehensive metabolic panel    Lipid panel  3. Vitamin D deficiency  E55.9 VITAMIN D 25 Hydroxy  (Vit-D Deficiency, Fractures)    I,Alexis Bryant,acting as a scribe for [name], MD.,have documented all relevant documentation on the behalf of [name], MD,as directed by  [name], MD while in the presence of [name], MD.   I, [name], MD, have reviewed all documentation for this visit. The documentation on 12/13/20 for the exam, diagnosis, procedures, and orders are all accurate and complete.   Return precautions advised.  [name], MD

## 2020-12-14 LAB — COMPREHENSIVE METABOLIC PANEL
ALT: 27 U/L (ref 0–35)
AST: 23 U/L (ref 0–37)
Albumin: 4.3 g/dL (ref 3.5–5.2)
Alkaline Phosphatase: 75 U/L (ref 39–117)
BUN: 11 mg/dL (ref 6–23)
CO2: 28 mEq/L (ref 19–32)
Calcium: 9.4 mg/dL (ref 8.4–10.5)
Chloride: 105 mEq/L (ref 96–112)
Creatinine, Ser: 0.73 mg/dL (ref 0.40–1.20)
GFR: 92.68 mL/min (ref >60.00)
Glucose, Bld: 98 mg/dL (ref 70–99)
Potassium: 4.1 mEq/L (ref 3.5–5.1)
Sodium: 141 mEq/L (ref 135–145)
Total Bilirubin: 0.5 mg/dL (ref 0.2–1.2)
Total Protein: 7.4 g/dL (ref 6.0–8.3)

## 2020-12-14 LAB — LIPID PANEL
Cholesterol: 178 mg/dL (ref 0–200)
HDL: 47.3 mg/dL (ref >39.00)
LDL Cholesterol: 118 mg/dL — ABNORMAL HIGH (ref 0–99)
NonHDL: 130.5
Total CHOL/HDL Ratio: 4
Triglycerides: 65 mg/dL (ref 0.0–149.0)
VLDL: 13 mg/dL (ref 0.0–40.0)

## 2021-02-03 ENCOUNTER — Other Ambulatory Visit: Payer: Self-pay | Admitting: Family Medicine

## 2021-02-03 DIAGNOSIS — Z1231 Encounter for screening mammogram for malignant neoplasm of breast: Secondary | ICD-10-CM

## 2021-02-05 DIAGNOSIS — Z1231 Encounter for screening mammogram for malignant neoplasm of breast: Secondary | ICD-10-CM

## 2021-02-10 ENCOUNTER — Other Ambulatory Visit: Payer: Self-pay | Admitting: Family Medicine

## 2021-03-24 ENCOUNTER — Ambulatory Visit (INDEPENDENT_AMBULATORY_CARE_PROVIDER_SITE_OTHER): Payer: BC Managed Care – PPO | Admitting: Obstetrics & Gynecology

## 2021-03-24 ENCOUNTER — Other Ambulatory Visit: Payer: Self-pay

## 2021-03-24 ENCOUNTER — Ambulatory Visit: Payer: BC Managed Care – PPO | Admitting: Family Medicine

## 2021-03-24 ENCOUNTER — Encounter: Payer: Self-pay | Admitting: Obstetrics & Gynecology

## 2021-03-24 VITALS — BP 120/76 | HR 79 | Resp 16 | Ht 66.75 in | Wt 230.0 lb

## 2021-03-24 DIAGNOSIS — Z90710 Acquired absence of both cervix and uterus: Secondary | ICD-10-CM

## 2021-03-24 DIAGNOSIS — Z780 Asymptomatic menopausal state: Secondary | ICD-10-CM

## 2021-03-24 DIAGNOSIS — Z01419 Encounter for gynecological examination (general) (routine) without abnormal findings: Secondary | ICD-10-CM | POA: Diagnosis not present

## 2021-03-24 DIAGNOSIS — Z6836 Body mass index (BMI) 36.0-36.9, adult: Secondary | ICD-10-CM

## 2021-03-24 NOTE — Progress Notes (Signed)
[name] [name] [facility] January 17, 1966 [id number]   History:    55 y.o. G4P2A2L2 Married.   RP:  Established patient presenting for annual gyn exam    HPI: S/P Total Hysterectomy.  No pelvic pain.  No pain with IC, using coconut oil.  No menopausal Sx.  Breasts wnl.  Urine/BMs wnl.  Obesity, BMI 36.29.  Not physically active. Difficulty loosing weight.  cHTN followed by Dr Durene Cal.  Health labs with Dr Durene Cal.  Colonoscopy 07/2017.   Past medical history,surgical history, family history and social history were all reviewed and documented in the EPIC chart.  Gynecologic History No LMP recorded. Patient has had a hysterectomy.  Obstetric History OB History  Gravida Para Term Preterm AB Living  4 2     2 2   SAB IAB Ectopic Multiple Live Births               # Outcome Date GA Lbr Len/2nd Weight Sex Delivery Anes PTL Lv  4 AB           3 AB           2 Para           1 Para              ROS: A ROS was performed and pertinent positives and negatives are included in the history.  GENERAL: No fevers or chills. HEENT: No change in vision, no earache, sore throat or sinus congestion. NECK: No pain or stiffness. CARDIOVASCULAR: No chest pain or pressure. No palpitations. PULMONARY: No shortness of breath, cough or wheeze. GASTROINTESTINAL: No abdominal pain, nausea, vomiting or diarrhea, melena or bright red blood per rectum. GENITOURINARY: No urinary frequency, urgency, hesitancy or dysuria. MUSCULOSKELETAL: No joint or muscle pain, no back pain, no recent trauma. DERMATOLOGIC: No rash, no itching, no lesions. ENDOCRINE: No polyuria, polydipsia, no heat or cold intolerance. No recent change in weight. HEMATOLOGICAL: No anemia or easy bruising or bleeding. NEUROLOGIC: No headache, seizures, numbness, tingling or weakness. PSYCHIATRIC: No depression, no loss of interest in normal activity or change in sleep pattern.     Exam:   BP 120/76   Pulse 79   Resp 16   Ht 5' 6.75" (1.695 m)   Wt 230 lb  (104.3 kg)   BMI 36.29 kg/m   Body mass index is 36.29 kg/m.  General appearance : Well developed well nourished female. No acute distress HEENT: Eyes: no retinal hemorrhage or exudates,  Neck supple, trachea midline, no carotid bruits, no thyroidmegaly Lungs: Clear to auscultation, no rhonchi or wheezes, or rib retractions  Heart: Regular rate and rhythm, no murmurs or gallops Breast:Examined in sitting and supine position were symmetrical in appearance, no palpable masses or tenderness,  no skin retraction, no nipple inversion, no nipple discharge, no skin discoloration, no axillary or supraclavicular lymphadenopathy Abdomen: no palpable masses or tenderness, no rebound or guarding Extremities: no edema or skin discoloration or tenderness  Pelvic: Vulva: Normal             Vagina: No gross lesions or discharge  Cervix/Uterus absent  Adnexa  Without masses or tenderness  Anus: Normal   Assessment/Plan:  55 y.o. female for annual exam   1. Well female exam with routine gynecological exam Gynecologic exam status post total hysterectomy.  Pap test negative in 2021, no indication to repeat a Pap test this year.  Breast exam normal.  Screening mammogram schedule next week.  Colonoscopy 2019.  Health labs with  family physician.  2. Hx of total hysterectomy  3. Postmenopause Well on no hormone replacement therapy.  Using coconut oil for vaginal dryness.  4. Class 2 severe obesity due to excess calories with serious comorbidity and body mass index (BMI) of 36.0 to 36.9 in adult [facility])  Recommend a lower calorie/carb diet.  Intermittent fasting reviewed.  Recommend aerobic activities 5 times a week and light weightlifting every 2 days.  Genia Del MD, 8:03 AM 03/24/2021

## 2021-03-25 ENCOUNTER — Ambulatory Visit
Admission: RE | Admit: 2021-03-25 | Discharge: 2021-03-25 | Disposition: A | Discharge status: Home / Self Care | Payer: BC Managed Care – PPO | Source: Ambulatory Visit

## 2021-03-25 DIAGNOSIS — Z1231 Encounter for screening mammogram for malignant neoplasm of breast: Secondary | ICD-10-CM | POA: Diagnosis not present

## 2021-04-20 ENCOUNTER — Telehealth: Payer: Self-pay

## 2021-04-20 NOTE — Telephone Encounter (Signed)
Pt called stating that she is getting her conceal and carry permit. [name] [name] stated that she needs Dr Durene Cal to write a letter that she is mentally/physically stable to have the permit. Can the letter be written? Please Advise.

## 2021-04-21 NOTE — Telephone Encounter (Signed)
Letter has been written and pt notified, pt would like letter to be given to her husband when he comes to see Dr. Durene Cal next week. I have made Jazz aware to give it to him.

## 2021-04-21 NOTE — Telephone Encounter (Signed)
Ok to write letter

## 2021-04-21 NOTE — Telephone Encounter (Signed)
You may write letter  Patient is medically and mentally stable for concealed carry permit.

## 2021-05-05 ENCOUNTER — Ambulatory Visit: Payer: BC Managed Care – PPO | Admitting: Obstetrics & Gynecology

## 2021-05-10 ENCOUNTER — Encounter: Payer: Self-pay | Admitting: Obstetrics & Gynecology

## 2021-05-10 ENCOUNTER — Ambulatory Visit: Payer: BC Managed Care – PPO | Admitting: Obstetrics & Gynecology

## 2021-05-10 ENCOUNTER — Other Ambulatory Visit: Payer: Self-pay

## 2021-05-10 VITALS — BP 128/84

## 2021-05-10 DIAGNOSIS — M7918 Myalgia, other site: Secondary | ICD-10-CM

## 2021-05-10 DIAGNOSIS — K641 Second degree hemorrhoids: Secondary | ICD-10-CM

## 2021-05-10 MED ORDER — HYDROCORTISONE (PERIANAL) 2.5 % EX CREA: 1.0000 "application " | TOPICAL_CREAM | Freq: Two times a day (BID) | CUTANEOUS | 1 refills | Status: AC

## 2021-05-10 NOTE — Progress Notes (Signed)
    [name] [name] [facility] 16-Dec-1965 [id number]        55 y.o.  [id number]   RP: Bilateral buttocks pain/Hemorrhoid  HPI: Patient complains of new bilateral pain at the buttocks.  No increase in physical activity recently.  The pain comes and goes but is rather random with no Pacific position or activity bringing the pain.  There is no numbing or weakness.  No lesion or rash seen on the buttocks.  No radiation to the lower limbs.  Patient also complains of hemorrhoids with discomfort.  No bleeding.   OB History  Gravida Para Term Preterm AB Living  4 2     2 2   SAB IAB Ectopic Multiple Live Births               # Outcome Date GA Lbr Len/2nd Weight Sex Delivery Anes PTL Lv  4 AB           3 AB           2 Para           1 Para             Past medical history,surgical history, problem list, medications, allergies, family history and social history were all reviewed and documented in the EPIC chart.   Directed ROS with pertinent positives and negatives documented in the history of present illness/assessment and plan.  Exam:  Vitals:   05/10/21 1021  BP: 128/84   General appearance:  Normal  Abdomen: Normal  Gynecologic exam: Vulva normal.  Buttocks: Skin normal.  No lesion seen.  Gross neuro exam at that level normal.  Normal muscle mass bilaterally.  Nontender to palpation.  Perianal area: Small soft hemorrhoid.  No thrombosis.  No bleeding.   Assessment/Plan:  55 y.o. 53   1. Grade II hemorrhoids Small external hemorrhoids without thrombosis or bleeding.  Will treat with Anusol HC 2.5% rectal cream.  Usage reviewed and prescription sent to pharmacy.  2. Bilateral buttock pain No sign of infection or gynecologic origin to patient's buttocks pain.  The differential diagnosis includes a lumbar issue with possible disc disease.  Could also be ligamentous or muscular in origin.  Recommend investigating with her family physician.  Other orders - hydrocortisone  (ANUSOL-HC) 2.5 % rectal cream; Place 1 application rectally 2 (two) times daily for 14 days.   [id number] MD, 10:47 AM 05/10/2021

## 2021-05-16 ENCOUNTER — Encounter: Payer: Self-pay | Admitting: Obstetrics & Gynecology

## 2021-05-18 ENCOUNTER — Other Ambulatory Visit: Payer: Self-pay

## 2021-05-18 ENCOUNTER — Ambulatory Visit: Payer: BC Managed Care – PPO | Admitting: Family Medicine

## 2021-05-18 ENCOUNTER — Ambulatory Visit: Payer: Self-pay

## 2021-05-18 VITALS — BP 122/82 | HR 74 | Ht 66.75 in | Wt 233.6 lb

## 2021-05-18 DIAGNOSIS — R2232 Localized swelling, mass and lump, left upper limb: Secondary | ICD-10-CM | POA: Diagnosis not present

## 2021-05-18 DIAGNOSIS — M79642 Pain in left hand: Secondary | ICD-10-CM | POA: Diagnosis not present

## 2021-05-18 NOTE — Progress Notes (Signed)
   I, Christoper Fabian, LAT, ATC, am serving as scribe for Dr. Clementeen Graham.  [name] [name] is a 55 y.o. female who presents to [facility] today for L ring finger pain.  She was last seen by Dr. Denyse Amass on 03/30/20 for f/u of R heel and L hand (3rd finger) pain and had surgery with Dr. Melvyn Novas at [facility].  Today, pt reports L 4th finger pain has been worsening over the past month. Pt locates pain to the L 4th IP joint w/ nodule present over the dorsal aspect.  She is willing to have surgery for the nodule and would like to avoid an injection.  Pertinent review of systems: No fevers or chills  Relevant historical information: Hyperlipidemia   Exam:  BP 122/82   Pulse 74   Ht 5' 6.75" (1.695 m)   Wt 233 lb 9.6 oz (106 kg)   SpO2 98%   BMI 36.86 kg/m  General: Well Developed, well nourished, and in no acute distress.   MSK: Left hand fourth digit nodule at dorsal PIP.  Mildly tender palpation normal hand motion.  Intact strength.    Lab and Radiology Results  Diagnostic Limited MSK Ultrasound of: Left hand dorsal fourth PIP Solid nodule visualized with ultrasound.  Does not appear to be cystic.  Not consistent in appearance with typical ganglion cyst. Impression: Solid dorsal nodule fourth PIP left hand      Assessment and Plan: 55 y.o. female with nodule left hand fourth PIP dorsal. Etiology is somewhat unclear.  This is very similar to a issue that she had last year on the left hand third PIP that was removed surgically by Dr. Melvyn Novas [facility].  Unfortunately I do not have access to his notes or the surgical pathology.  I suspect the nodule today is the same as it was on the third finger last year.  It may be an atypical appearance of a ganglion cyst or something else.  Regardless she wants to proceed directly to surgery which I think is reasonable.  We will refer directly to Dr. Melvyn Novas again.  Check back with me as  needed   PDMP not reviewed this encounter. Orders Placed This Encounter  Procedures   [place] LIMITED JOINT SPACE STRUCTURES UP LEFT(NO LINKED CHARGES)    Standing Status:   Future    Number of Occurrences:   1    Standing Expiration Date:   11/15/2021    Order Specific Question:   Reason for Exam (SYMPTOM  OR DIAGNOSIS REQUIRED)    Answer:   left 4th finger pain    Order Specific Question:   Preferred imaging location?    Answer:   [facility] referral to Orthopedic Surgery    Referral Priority:   Routine    Referral Type:   Surgical    Referral Reason:   Specialty Services Required    Requested Specialty:   Orthopedic Surgery    Number of Visits Requested:   1   No orders of the defined types were placed in this encounter.    Discussed warning signs or symptoms. Please see discharge instructions. Patient expresses understanding.   The above documentation has been reviewed and is accurate and complete Clementeen Graham, M.D.

## 2021-05-18 NOTE — Patient Instructions (Addendum)
Thank you for coming in today.   I placed a referral for you to see Dr. Orlan Leavens at [facility] for surgical consultation.  Recheck back with me as needed

## 2021-06-16 DIAGNOSIS — R2232 Localized swelling, mass and lump, left upper limb: Secondary | ICD-10-CM | POA: Diagnosis not present

## 2021-06-22 NOTE — Progress Notes (Signed)
Phone [phone]   Subjective:  Patient presents today for their annual physical. Chief complaint-noted.   See problem oriented charting- ROS- full  review of systems was completed and negative except for: light sensitivity and some blurry vision in AM- thinks may be a bright light she has  The following were reviewed and entered/updated in epic: Past Medical History:  Diagnosis Date   Allergy    Back pain    Hyperlipidemia    not on meds trying diet and exercise   Hypertension    not on meds  diet and exercise   Joint pain    Obesity    Patient Active Problem List   Diagnosis Date Noted   Hypertension, essential 05/01/2017    Priority: Medium    Hyperlipidemia 10/20/2014    Priority: Medium    Vitamin D deficiency 08/06/2019   Right arm pain 06/08/2017   Obesity 10/20/2014   Past Surgical History:  Procedure Laterality Date   ABDOMINAL HYSTERECTOMY     fibroids. Dr. Faythe Casa including cervix   ABDOMINAL SURGERY     abdominoplasty- in late 15's    CESAREAN SECTION     x2, 61 and 87   CHOLECYSTECTOMY     tummy tuck      Family History  Problem Relation Age of Onset   Heart disease Mother        in early 85s, nonsmoker   Heart attack Mother    Hypertension Mother    Diabetes Mother    Liver disease Father    Drug abuse Father    Diabetes Sister    Colon cancer Neg Hx    Colon polyps Neg Hx    Esophageal cancer Neg Hx    Rectal cancer Neg Hx    Stomach cancer Neg Hx    Breast cancer Neg Hx     Medications- reviewed and updated Current Outpatient Medications  Medication Sig Dispense Refill   amLODipine (NORVASC) 10 MG tablet Take 1 tablet (10 mg total) by mouth daily. 90 tablet 3   Vitamin D, Ergocalciferol, (DRISDOL) 1.25 MG (50000 UNIT) CAPS capsule TAKE 1 CAPSULE (50,000 UNITS TOTAL) BY MOUTH EVERY 7 (SEVEN) DAYS 12 capsule 1   No current facility-administered medications for this visit.    Allergies-reviewed and updated No Known  Allergies  Social History   Social History Narrative   remarried to Martin around 2017.  Divorced. 2 children. 1 grandchild Fredricka Bonine born in 2016).       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   Pulse 75    Temp 98.4 F (36.9 C)    Ht 5' 6.75" (1.695 m)    Wt 233 lb 9.6 oz (106 kg)    SpO2 97%    BMI 36.86 kg/m  Gen: NAD, resting comfortably HEENT: Mucous membranes are moist. Oropharynx normal Neck: no thyromegaly CV: RRR no murmurs rubs or gallops Lungs: CTAB no crackles, wheeze, rhonchi Abdomen: soft/nontender/nondistended/normal bowel sounds. No rebound or guarding.  Ext: trace edema Skin: warm, dry Neuro: grossly normal, moves all extremities, PERRLA   Assessment and Plan   55 y.o. female presenting for annual physical.  Health Maintenance counseling: 1. Anticipatory guidance: Patient counseled regarding regular dental exams q6 months, eye exams - has been sometime- we discussed following up- with eyemart (brief blurry vision in AM  that gets better as soon as turns lights off- does not occur if more light outside and doesn't use this light) ,  avoiding smoking and second hand smoke , limiting alcohol to 1 beverage per day- vodka and cranberry juice .  No illicit drugs.  2. Risk factor reduction:  Advised patient of need for regular exercise and diet rich and fruits and vegetables to reduce risk of heart attack and stroke. Exercise- doing more activity at home-getting more steps in the home and running around with grandkids. Might try to walk with her sister (just did a spartan rest) Diet/management-weight down 2 lbs on home scales and from last visit- avoiding sweets and watching portion sizes Wt Readings from Last 3 Encounters:  06/29/21 233 lb 9.6 oz (106 kg)  05/18/21 233 lb 9.6 oz (106 kg)  03/24/21 230 lb (104.3 kg)   3.  Immunizations/screenings/ancillary studies DISCUSSED:  -covid booster vaccination #3- consider at pharmacy -flu vaccination (last one 04/2019) - today -Shingrix vaccination #1- consider next year Immunization History  Administered Date(s) Administered   Influenza,inj,Quad PF,6+ Mos 05/01/2017, 08/05/2018, 04/18/2019   PFIZER(Purple Top)SARS-COV-2 Vaccination 10/09/2019, 11/03/2019   Tdap 04/21/2014  4. Cervical cancer screening-pap smear 03/18/20 with 3 year repeat planned -S/p total hysterectomy as well for benign reasons for fibroids- will get ROI 5. Breast cancer screening-  breast exam  with GYN and mammogram 03/25/21 with 1 year repeat planned  6. Colon cancer screening - 07/23/17 with 10 year repeat planned 7. Skin cancer screening-  lower risk due to melanin content.advised regular sunscreen use. Denies worrisome, changing, or new skin lesions.  8. Birth control/STD check- hysterectomy. Only active with husband 9. Osteoporosis screening at 71- Will plan on this. No family history -Never smoker  Status of chronic or acute concerns   # Hypertension  S:Medication: Amlodipine 10 mg daily BP Readings from Last 3 Encounters:  06/29/21 128/78  05/18/21 122/82  05/10/21 128/84  A/P: Controlled. Continue current medications.   # Vitamin D deficiency  S:Medication: 50k every 7 days Last vitamin D Lab Results  Component Value Date   VD25OH 24.62 (L) 12/13/2020  A/P: hold off on repeat because still actively taking treatment- when she stops this medication would start 1000 units per day for  maintenance long term   #hyperlipidemia S:Medication: none  Lab Results  Component Value Date   CHOL 178 12/13/2020   HDL 47.30 12/13/2020   LDLCALC 118 (H) 12/13/2020   TRIG 65.0 12/13/2020   CHOLHDL 4 12/13/2020   A/P: 10-year ASCVD risk of only 5.1%-we discussed possible CT cardiac scoring if this risk is above 7.5%-for now we will continue to work on healthy eating/regular exercise/weight  loss  #ganglion cyst-procedure went well and stable in regards to her finger. Another procedure upcoming late January 2023 with Dr. Melvyn Novas.    #Working through some orthopedic issues with the right lower leg with Ortho- found to have heel spur-did better with exercise, boot, wanted to avoid shot or surgery   Recommended follow up: Return in about 1 year (around 06/29/2022) for physical or sooner if needed. Future Appointments  Date Time Provider Department Center  04/05/2022  8:00 AM Genia Del, MD [facility] None   Lab/Order associations: we opted for labs next years   ICD-10-CM   1. Preventative health care  Z00.00     2. Hyperlipidemia, unspecified hyperlipidemia type  E78.5     3. Essential hypertension  I10     4. Vitamin D deficiency  E55.9  No orders of the defined types were placed in this encounter.  I,Jada Bradford,acting as a scribe for [name], MD.,have documented all relevant documentation on the behalf of [name], MD,as directed by  [name], MD while in the presence of [name], MD.  I, [name], MD, have reviewed all documentation for this visit. The documentation on 06/29/21 for the exam, diagnosis, procedures, and orders are all accurate and complete.  Return precautions advised.  [name], MD

## 2021-06-22 NOTE — Patient Instructions (Addendum)
Health Maintenance Due  Topic Date Due   Zoster Vaccines- Shingrix (1 of 2) - Hold off at this time. Never done   COVID-19 Vaccine (3 - Booster for [employer] series)- Please consider getting your bivalent booster shot at your local pharmacy. When received, please let us know.   12/29/2019   INFLUENZA VACCINE - Thank you for getting your regular dose flu shot today in office!  02/07/2021   Sign release of information at the check out desk for recent pap smear.   Please take Vitamin D 1000 units over-the-counter medication a day for maintenance when you finish prescription high dose  I love that you have been doing more activities, watching your caloric intake and motivated to exercise! Please keep up the great work!   Recommended follow up: Return in about 1 year (around 06/29/2022) for physical or sooner if needed.  Happy Holidays!

## 2021-06-29 ENCOUNTER — Encounter: Payer: Self-pay | Admitting: Family Medicine

## 2021-06-29 ENCOUNTER — Ambulatory Visit (INDEPENDENT_AMBULATORY_CARE_PROVIDER_SITE_OTHER): Payer: BC Managed Care – PPO | Admitting: Family Medicine

## 2021-06-29 ENCOUNTER — Other Ambulatory Visit: Payer: Self-pay

## 2021-06-29 VITALS — BP 128/78 | HR 75 | Temp 98.4°F | Ht 66.75 in | Wt 233.6 lb

## 2021-06-29 DIAGNOSIS — I10 Essential (primary) hypertension: Secondary | ICD-10-CM

## 2021-06-29 DIAGNOSIS — E785 Hyperlipidemia, unspecified: Secondary | ICD-10-CM

## 2021-06-29 DIAGNOSIS — Z0000 Encounter for general adult medical examination without abnormal findings: Secondary | ICD-10-CM

## 2021-06-29 DIAGNOSIS — Z23 Encounter for immunization: Secondary | ICD-10-CM | POA: Diagnosis not present

## 2021-06-29 DIAGNOSIS — E559 Vitamin D deficiency, unspecified: Secondary | ICD-10-CM | POA: Diagnosis not present

## 2021-07-16 ENCOUNTER — Other Ambulatory Visit: Payer: Self-pay | Admitting: Family Medicine

## 2021-07-28 DIAGNOSIS — L905 Scar conditions and fibrosis of skin: Secondary | ICD-10-CM | POA: Diagnosis not present

## 2021-07-28 DIAGNOSIS — R2232 Localized swelling, mass and lump, left upper limb: Secondary | ICD-10-CM | POA: Diagnosis not present

## 2021-08-29 DIAGNOSIS — R051 Acute cough: Secondary | ICD-10-CM | POA: Diagnosis not present

## 2021-10-08 ENCOUNTER — Other Ambulatory Visit: Payer: Self-pay | Admitting: Family Medicine

## 2021-11-02 ENCOUNTER — Ambulatory Visit: Payer: BC Managed Care – PPO | Admitting: Obstetrics & Gynecology

## 2021-11-02 ENCOUNTER — Encounter: Payer: Self-pay | Admitting: Obstetrics & Gynecology

## 2021-11-02 VITALS — BP 118/80

## 2021-11-02 DIAGNOSIS — K602 Anal fissure, unspecified: Secondary | ICD-10-CM | POA: Diagnosis not present

## 2021-11-02 DIAGNOSIS — R1013 Epigastric pain: Secondary | ICD-10-CM | POA: Diagnosis not present

## 2021-11-02 DIAGNOSIS — K641 Second degree hemorrhoids: Secondary | ICD-10-CM | POA: Diagnosis not present

## 2021-11-02 MED ORDER — CLOBETASOL PROPIONATE 0.05 % EX OINT: 1.0000 "application " | TOPICAL_OINTMENT | Freq: Every day | CUTANEOUS | 1 refills | Status: AC

## 2021-11-02 NOTE — Progress Notes (Signed)
? ? ?  [name] [name] [facility] 10-30-1965 [id number] ? ? ?     56 y.o.  [id number]  ? ?RP: Upper abdominal pain x 2 days and burning with hemorrhoids ? ?HPI: Upper abdominal pain x 2 days.  The pain was severe in the upper abdomen for 2 days.  The pain is now completely resolved.  No constipation.  Colonoscopy normal in 07/2017.  Urine normal.  S/P Total Hysterectomy. No pain with IC, using coconut oil.  Burning at the Rt perianal area with a tinge of blood when wiping.  Known external hemorrhoids.  Not using CS currently.     ? ? ?OB History  ?Gravida Para Term Preterm AB Living  ?4 2     2 2   ?SAB IAB Ectopic Multiple Live Births  ?        2  ?  ?# Outcome Date GA Lbr Len/2nd Weight Sex Delivery Anes PTL Lv  ?4 AB           ?3 AB           ?2 Para           ?1 Para           ? ? ?Past medical history,surgical history, problem list, medications, allergies, family history and social history were all reviewed and documented in the EPIC chart. ? ? ?Directed ROS with pertinent positives and negatives documented in the history of present illness/assessment and plan. ? ?Exam: ? ?Vitals:  ? 11/02/21 0759  ?BP: 118/80  ? ?General appearance:  Normal ? ?Abdomen: Soft, non-tender, not distended, no mass. ? ?Gynecologic exam: Vulva normal.  Perianal area with a small fissure on the right side.  Non-thrombosed external hemorrhoid.  No bleeding. ? ? ?Assessment/Plan:  56 y.o. 59  ? ?1. Epigastric pain ?Upper abdominal pain x 2 days.  The pain was severe in the upper abdomen for 2 days.  The pain is now completely resolved.  No constipation.  Colonoscopy normal in 07/2017.  Urine normal.  S/P Total Hysterectomy. No pain with IC, using coconut oil.  Will make an appointment with her Gastro. ? ?2. Anal fissure ?Burning at the Rt perianal area with a tinge of blood when wiping.  Small fissure at the Rt perianal area.  Recommendations discussed to avoid further irritation.  Management reviewed, will treat with Clobetasol ointment.  Apply a  thin layer at the involved perianal skin daily x 1 week, then start weaning progressively.  Prescription sent to pharmacy.  ? ?3. Grade II hemorrhoids ?Known external hemorrhoids.  Not using CS currently.  Small non-thrombosed, not inflamed external hemorrhoid. ? ?Other orders ?- clobetasol ointment (TEMOVATE) 0.05 %; Apply 1 application. topically daily for 7 days. After a week, start weaning progressively.  ? ?08/2017 MD, 8:26 AM 11/02/2021 ? ? ? ?  ?

## 2022-02-03 DIAGNOSIS — J029 Acute pharyngitis, unspecified: Secondary | ICD-10-CM | POA: Diagnosis not present

## 2022-02-15 ENCOUNTER — Encounter (INDEPENDENT_AMBULATORY_CARE_PROVIDER_SITE_OTHER): Payer: Self-pay

## 2022-03-21 ENCOUNTER — Other Ambulatory Visit: Payer: Self-pay | Admitting: Family Medicine

## 2022-03-21 DIAGNOSIS — Z1231 Encounter for screening mammogram for malignant neoplasm of breast: Secondary | ICD-10-CM

## 2022-04-03 ENCOUNTER — Encounter: Payer: Self-pay | Admitting: *Deleted

## 2022-04-05 ENCOUNTER — Encounter: Payer: Self-pay | Admitting: Obstetrics & Gynecology

## 2022-04-05 ENCOUNTER — Ambulatory Visit (INDEPENDENT_AMBULATORY_CARE_PROVIDER_SITE_OTHER): Payer: BC Managed Care – PPO | Admitting: Obstetrics & Gynecology

## 2022-04-05 VITALS — BP 120/74 | HR 74 | Resp 16 | Ht 66.75 in | Wt 226.0 lb

## 2022-04-05 DIAGNOSIS — Z6835 Body mass index (BMI) 35.0-35.9, adult: Secondary | ICD-10-CM

## 2022-04-05 DIAGNOSIS — Z780 Asymptomatic menopausal state: Secondary | ICD-10-CM | POA: Diagnosis not present

## 2022-04-05 DIAGNOSIS — Z90710 Acquired absence of both cervix and uterus: Secondary | ICD-10-CM | POA: Diagnosis not present

## 2022-04-05 DIAGNOSIS — Z01419 Encounter for gynecological examination (general) (routine) without abnormal findings: Secondary | ICD-10-CM

## 2022-04-05 NOTE — Progress Notes (Signed)
[name] [name] [name] [name] June 04, 1966 [id number]   History:    56 y.o. G4P2A2L2 Married.   RP:  Established patient presenting for annual gyn exam    HPI: Postmenopause, well on no HRT.  S/P Total Hysterectomy.  No pelvic pain.  No pain with IC, using coconut oil. Pap Neg 03/2020.  No h/o abnormal Pap.  Will repeat Pap every 5 years.  Breasts wnl.  Mammo scheduled tomorrow.  Urine/BMs wnl. Colono 07/2017, every 5 years. Obesity, BMI improved to 35.66.  Increase fitness/weight bearing activities. Low calorie diet and intermittent fasting recommended.  cHTN followed by Dr Yong Channel.  Health labs with Dr Yong Channel.    Past medical history,surgical history, family history and social history were all reviewed and documented in the EPIC chart.  Gynecologic History No LMP recorded. Patient has had a hysterectomy.  Obstetric History OB History  Gravida Para Term Preterm AB Living  4 2 2   2 2   SAB IAB Ectopic Multiple Live Births    2     2    # Outcome Date GA Lbr Len/2nd Weight Sex Delivery Anes PTL Lv  4 IAB           3 IAB           2 Term           1 Term              ROS: A ROS was performed and pertinent positives and negatives are included in the history.  GENERAL: No fevers or chills. HEENT: No change in vision, no earache, sore throat or sinus congestion. NECK: No pain or stiffness. CARDIOVASCULAR: No chest pain or pressure. No palpitations. PULMONARY: No shortness of breath, cough or wheeze. GASTROINTESTINAL: No abdominal pain, nausea, vomiting or diarrhea, melena or bright red blood per rectum. GENITOURINARY: No urinary frequency, urgency, hesitancy or dysuria. MUSCULOSKELETAL: No joint or muscle pain, no back pain, no recent trauma. DERMATOLOGIC: No rash, no itching, no lesions. ENDOCRINE: No polyuria, polydipsia, no heat or cold intolerance. No recent change in weight. HEMATOLOGICAL: No anemia or easy bruising or bleeding. NEUROLOGIC: No headache, seizures, numbness, tingling or weakness.  PSYCHIATRIC: No depression, no loss of interest in normal activity or change in sleep pattern.     Exam:   BP 120/74   Pulse 74   Resp 16   Ht 5' 6.75" (1.695 m)   Wt 226 lb (102.5 kg)   BMI 35.66 kg/m   Body mass index is 35.66 kg/m.  General appearance : Well developed well nourished female. No acute distress HEENT: Eyes: no retinal hemorrhage or exudates,  Neck supple, trachea midline, no carotid bruits, no thyroidmegaly Lungs: Clear to auscultation, no rhonchi or wheezes, or rib retractions  Heart: Regular rate and rhythm, no murmurs or gallops Breast:Examined in sitting and supine position were symmetrical in appearance, no palpable masses or tenderness,  no skin retraction, no nipple inversion, no nipple discharge, no skin discoloration, no axillary or supraclavicular lymphadenopathy Abdomen: no palpable masses or tenderness, no rebound or guarding Extremities: no edema or skin discoloration or tenderness  Pelvic: Vulva: Normal             Vagina: No gross lesions or discharge  Cervix/Uterus absent  Adnexa  Without masses or tenderness  Anus: Normal   Assessment/Plan:  56 y.o. female for annual exam   1. Well female exam with routine gynecological exam Postmenopause, well on no HRT.  S/P Total Hysterectomy.  No  pelvic pain.  No pain with IC, using coconut oil. Pap Neg 03/2020.  No h/o abnormal Pap.  Will repeat Pap every 5 years.  Breasts wnl.  Mammo scheduled tomorrow.  Urine/BMs wnl. Colono 07/2017, every 5 years. Obesity, BMI improved to 35.66.  Increase fitness/weight bearing activities. Low calorie diet and intermittent fasting recommended.  cHTN followed by Dr Durene Cal.  Health labs with Dr Durene Cal.    2. Hx of total hysterectomy  3. Postmenopause Postmenopause, well on no HRT.  S/P Total Hysterectomy.  No pelvic pain.  No pain with IC, using coconut oil.  Continue Vit D supplement, Vit K2, Ca++ total 1.5 g/d.  Wt bearing activities.    4. Class 2 severe obesity due  to excess calories with serious comorbidity and body mass index (BMI) of 35.0 to 35.9 in adult [name])  Obesity, BMI improved to 35.66.  Increase fitness/weight bearing activities. Low calorie diet and intermittent fasting recommended.  Genia Del MD, 8:12 AM 04/05/2022

## 2022-04-06 ENCOUNTER — Ambulatory Visit
Admission: RE | Admit: 2022-04-06 | Discharge: 2022-04-06 | Disposition: A | Discharge status: Home / Self Care | Payer: BC Managed Care – PPO | Source: Ambulatory Visit | Attending: Family Medicine | Admitting: Family Medicine

## 2022-04-06 DIAGNOSIS — Z1231 Encounter for screening mammogram for malignant neoplasm of breast: Secondary | ICD-10-CM | POA: Diagnosis not present

## 2022-04-07 ENCOUNTER — Other Ambulatory Visit: Payer: Self-pay | Admitting: Family Medicine

## 2022-04-07 DIAGNOSIS — R928 Other abnormal and inconclusive findings on diagnostic imaging of breast: Secondary | ICD-10-CM

## 2022-04-17 ENCOUNTER — Other Ambulatory Visit: Payer: Self-pay | Admitting: Family Medicine

## 2022-04-17 ENCOUNTER — Ambulatory Visit
Admission: RE | Admit: 2022-04-17 | Discharge: 2022-04-17 | Disposition: A | Discharge status: Home / Self Care | Payer: BC Managed Care – PPO | Source: Ambulatory Visit | Attending: Family Medicine | Admitting: Family Medicine

## 2022-04-17 DIAGNOSIS — N6489 Other specified disorders of breast: Secondary | ICD-10-CM | POA: Diagnosis not present

## 2022-04-17 DIAGNOSIS — R928 Other abnormal and inconclusive findings on diagnostic imaging of breast: Secondary | ICD-10-CM

## 2022-06-16 ENCOUNTER — Other Ambulatory Visit: Payer: Self-pay | Admitting: Family Medicine

## 2022-06-22 ENCOUNTER — Encounter: Payer: Self-pay | Admitting: *Deleted

## 2022-09-01 ENCOUNTER — Ambulatory Visit (INDEPENDENT_AMBULATORY_CARE_PROVIDER_SITE_OTHER): Payer: BC Managed Care – PPO | Admitting: Family Medicine

## 2022-09-01 ENCOUNTER — Encounter: Payer: Self-pay | Admitting: Family Medicine

## 2022-09-01 VITALS — BP 134/82 | HR 77 | Temp 97.2°F | Ht 66.75 in | Wt 238.2 lb

## 2022-09-01 DIAGNOSIS — I10 Essential (primary) hypertension: Secondary | ICD-10-CM | POA: Diagnosis not present

## 2022-09-01 DIAGNOSIS — Z0000 Encounter for general adult medical examination without abnormal findings: Secondary | ICD-10-CM | POA: Diagnosis not present

## 2022-09-01 DIAGNOSIS — E785 Hyperlipidemia, unspecified: Secondary | ICD-10-CM

## 2022-09-01 DIAGNOSIS — E559 Vitamin D deficiency, unspecified: Secondary | ICD-10-CM

## 2022-09-01 LAB — CBC WITH DIFFERENTIAL/PLATELET
Basophils Absolute: 0 10*3/uL (ref 0.0–0.1)
Basophils Relative: 0.8 % (ref 0.0–3.0)
Eosinophils Absolute: 0.2 10*3/uL (ref 0.0–0.7)
Eosinophils Relative: 4.3 % (ref 0.0–5.0)
HCT: 39.2 % (ref 36.0–46.0)
Hemoglobin: 12.9 g/dL (ref 12.0–15.0)
Lymphocytes Relative: 45.6 % (ref 12.0–46.0)
Lymphs Abs: 2.2 10*3/uL (ref 0.7–4.0)
MCHC: 32.9 g/dL (ref 30.0–36.0)
MCV: 95.2 fl (ref 78.0–100.0)
Monocytes Absolute: 0.3 10*3/uL (ref 0.1–1.0)
Monocytes Relative: 6.5 % (ref 3.0–12.0)
Neutro Abs: 2.1 10*3/uL (ref 1.4–7.7)
Neutrophils Relative %: 42.8 % — ABNORMAL LOW (ref 43.0–77.0)
Platelets: 276 10*3/uL (ref 150.0–400.0)
RBC: 4.12 Mil/uL (ref 3.87–5.11)
RDW: 13.2 % (ref 11.5–15.5)
WBC: 4.9 10*3/uL (ref 4.0–10.5)

## 2022-09-01 LAB — COMPREHENSIVE METABOLIC PANEL
ALT: 26 U/L (ref 0–35)
AST: 22 U/L (ref 0–37)
Albumin: 4.5 g/dL (ref 3.5–5.2)
Alkaline Phosphatase: 79 U/L (ref 39–117)
BUN: 12 mg/dL (ref 6–23)
CO2: 30 mEq/L (ref 19–32)
Calcium: 9.8 mg/dL (ref 8.4–10.5)
Chloride: 102 mEq/L (ref 96–112)
Creatinine, Ser: 0.69 mg/dL (ref 0.40–1.20)
GFR: 96.64 mL/min (ref >60.00)
Glucose, Bld: 87 mg/dL (ref 70–99)
Potassium: 4 mEq/L (ref 3.5–5.1)
Sodium: 139 mEq/L (ref 135–145)
Total Bilirubin: 0.5 mg/dL (ref 0.2–1.2)
Total Protein: 7.5 g/dL (ref 6.0–8.3)

## 2022-09-01 LAB — LIPID PANEL
Cholesterol: 203 mg/dL — ABNORMAL HIGH (ref 0–200)
HDL: 51.1 mg/dL (ref >39.00)
LDL Cholesterol: 130 mg/dL — ABNORMAL HIGH (ref 0–99)
NonHDL: 151.91
Total CHOL/HDL Ratio: 4
Triglycerides: 108 mg/dL (ref 0.0–149.0)
VLDL: 21.6 mg/dL (ref 0.0–40.0)

## 2022-09-01 LAB — VITAMIN D 25 HYDROXY (VIT D DEFICIENCY, FRACTURES): VITD: 30.23 ng/mL (ref 30.00–100.00)

## 2022-09-01 NOTE — Patient Instructions (Addendum)
Please stop by lab before you go If you have mychart- we will send your results within 3 business days of [place] receiving them.  If you do not have mychart- we will call you about results within 5 business days of [place] receiving them.  *please also note that you will see labs on mychart as soon as they post. I will later go in and write notes on them- will say "notes from Dr. Yong Channel"   We will call you within two weeks about your referral to CT calcium scoring through [facility].  Their phone number is [phone].  Please call them if you have not heard in 1-2 weeks  Recommended follow up: Return in about 6 months (around 03/02/2023) for followup or sooner if needed.Schedule b4 you leave. But definitely a year for a physical

## 2022-09-01 NOTE — Progress Notes (Signed)
Phone [phone]   Subjective:  Patient presents today for their annual physical. Chief complaint-noted.   See problem oriented charting- ROS- full  review of systems was completed and negative except for: sweating post menopause, light sensitivity in morning (wears shades in morning and does ok), sparing upper chest pain- triggered with tomato sauces- likely reflux- no exertional symptoms, occasional wheezing  The following were reviewed and entered/updated in epic: Past Medical History:  Diagnosis Date   Allergy    Back pain    Hyperlipidemia    not on meds trying diet and exercise   Hypertension    not on meds  diet and exercise   Joint pain    Obesity    Patient Active Problem List   Diagnosis Date Noted   Hypertension, essential 05/01/2017    Priority: Medium    Hyperlipidemia 10/20/2014    Priority: Medium    Vitamin D deficiency 08/06/2019   Right arm pain 06/08/2017   Obesity 10/20/2014   Past Surgical History:  Procedure Laterality Date   ABDOMINAL HYSTERECTOMY     fibroids. Dr. Tollie Eth including cervix   ABDOMINAL SURGERY     abdominoplasty- in late 26's    CESAREAN SECTION     x2, 70 and 87   CHOLECYSTECTOMY     tummy tuck      Family History  Problem Relation Age of Onset   Heart disease Mother        in early 47s, nonsmoker   Heart attack Mother    Hypertension Mother    Diabetes Mother    Liver disease Father    Drug abuse Father    Diabetes Sister     Medications- reviewed and updated Current Outpatient Medications  Medication Sig Dispense Refill   amLODipine (NORVASC) 10 MG tablet TAKE 1 TABLET BY MOUTH EVERY DAY 90 tablet 3   Vitamin D, Ergocalciferol, (DRISDOL) 1.25 MG (50000 UNIT) CAPS capsule TAKE 1 CAPSULE (50,000 UNITS TOTAL) BY MOUTH EVERY 7 (SEVEN) DAYS 12 capsule 1   No current facility-administered medications for this visit.    Allergies-reviewed and updated Allergies  Allergen Reactions   Shrimp Flavor Hives and  Itching    shrimp    Social History   Social History Narrative   remarried to [place] around 2017.  Divorced. 2 children. 1 grandchild Kae Heller born in 2016).       Works as Quarry manager at [employer]: ride motorcycle, reading, walking   Objective  Objective:  BP 134/82   Pulse 77   Temp (!) 97.2 F (36.2 C)   Ht 5' 6.75" (1.695 m)   Wt 238 lb 3.2 oz (108 kg)   SpO2 99%   BMI 37.59 kg/m  Gen: NAD, resting comfortably HEENT: Mucous membranes are moist. Oropharynx normal Neck: no thyromegaly CV: RRR no murmurs rubs or gallops Lungs: CTAB no crackles, wheeze, rhonchi Abdomen: soft/nontender/nondistended/normal bowel sounds. No rebound or guarding.  Ext: no edema Skin: warm, dry Neuro: grossly normal, moves all extremities, PERRLA   Assessment and Plan   57 y.o. female presenting for annual physical.  Health Maintenance counseling: 1. Anticipatory guidance: Patient counseled regarding regular dental exams -q6 months, eye exams - goes on the 19th,  avoiding smoking and second hand smoke , limiting alcohol to 1 beverage per day- usually 1 a day , no illicit drugs .   2. Risk factor reduction:  Advised patient of need for  regular exercise and diet rich and fruits and vegetables to reduce risk of heart attack and stroke.  Exercise- trying to improve. Starting to walk. Bought new bike. Goal 150 minutes a week Diet/weight management-weight up 5 pounds from last physical- has tried to stop eating after 6, cut out juice a few weeks ago, has not tried myfitnesspal. Doesn't eat out much Wt Readings from Last 3 Encounters:  09/01/22 238 lb 3.2 oz (108 kg)  04/05/22 226 lb (102.5 kg)  06/29/21 233 lb 9.6 oz (106 kg)  3. Immunizations/screenings/ancillary studies-declines COVID and flu shot and Shingrix  Immunization History  Administered Date(s) Administered   Influenza,inj,Quad PF,6+ Mos 05/01/2017, 08/05/2018, 04/18/2019, 06/29/2021    PFIZER(Purple Top)SARS-COV-2 Vaccination 10/09/2019, 11/03/2019   Tdap 04/21/2014  4. Cervical cancer screening-pap smear 03/18/20 with 5 year repeat per Dr. Javier Docker total hysterectomy as well for benign reasons for fibroids- will get ROI  5. Breast cancer screening-  breast exam  with GYN and mammogram 04/06/2022 with 51-monthfollow-up ultrasound and mammogram planned 6. Colon cancer screening - 07/23/17 with 10 year repeat planned  7. Skin cancer screening-  lower risk due to melanin content.advised regular sunscreen use. Denies worrisome, changing, or new skin lesions.   8. Birth control/STD check- hysterectomy. Only active with husband  9. Osteoporosis screening at 626 Will plan on this. No family history  -Never smoker  Status of chronic or acute concerns   # Hypertension  S:Medication: Amlodipine 10 mg  BP Readings from Last 3 Encounters:  09/01/22 134/82  04/05/22 120/74  11/02/21 118/80  A/P: [facility] bias like ACC guidelines- continue current medications    #hyperlipidemia S: Medication:none The 10-year ASCVD risk score (Arnett DK, et al., 2019) is: 6.3%- mom with heart disease in 70s Lab Results  Component Value Date   CHOL 178 12/13/2020   HDL 47.30 12/13/2020   LDLCALC 118 (H) 12/13/2020   TRIG 65.0 12/13/2020   CHOLHDL 4 12/13/2020   A/P: she is interested in ct calcium scoring- we ordered this- consider statin if elevations on that but otherwise hold off  # Vitamin D deficiency  S:Medication: 50k units a week  Last vitamin D Lab Results  Component Value Date   VD25OH 24.62 (L) 12/13/2020  A/P: update D on labs- has been on high dose and we may need to switch to just 1000 units a day if running high or high normal   Recommended follow up: Return in about 6 months (around 03/02/2023) for followup or sooner if needed.Schedule b4 you leave.  But definitely a year for a physical Future Appointments  Date Time  Provider [place] 10/19/2022  7:40 AM [facility] DIAG [place] 1 [facility]  10/19/2022  7:50 AM [facility] [place] [facility]   Lab/Order associations:NOT  fasting   ICD-10-CM   1. Preventative health care  Z00.00     2. Hyperlipidemia, unspecified hyperlipidemia type  E78.5 CBC with Differential/Platelet    Comprehensive metabolic panel    Lipid panel    3. Hypertension, essential  I10     4. Vitamin D deficiency  E55.9 Vitamin D (25 hydroxy)      No orders of the defined types were placed in this encounter.   Return precautions advised.  SGarret Reddish MD

## 2022-09-10 ENCOUNTER — Other Ambulatory Visit: Payer: Self-pay | Admitting: Family Medicine

## 2022-10-02 ENCOUNTER — Ambulatory Visit
Admission: RE | Admit: 2022-10-02 | Discharge: 2022-10-02 | Disposition: A | Discharge status: Home / Self Care | Payer: No Typology Code available for payment source | Source: Ambulatory Visit | Attending: Family Medicine | Admitting: Family Medicine

## 2022-10-02 DIAGNOSIS — E785 Hyperlipidemia, unspecified: Secondary | ICD-10-CM

## 2022-10-19 ENCOUNTER — Ambulatory Visit
Admission: RE | Admit: 2022-10-19 | Discharge: 2022-10-19 | Disposition: A | Discharge status: Home / Self Care | Payer: BC Managed Care – PPO | Source: Ambulatory Visit | Attending: Family Medicine | Admitting: Family Medicine

## 2022-10-19 ENCOUNTER — Ambulatory Visit: Payer: BC Managed Care – PPO

## 2022-10-19 DIAGNOSIS — R928 Other abnormal and inconclusive findings on diagnostic imaging of breast: Secondary | ICD-10-CM | POA: Diagnosis not present

## 2022-10-19 DIAGNOSIS — N6489 Other specified disorders of breast: Secondary | ICD-10-CM

## 2022-11-01 ENCOUNTER — Other Ambulatory Visit: Payer: Self-pay | Admitting: Family Medicine

## 2022-11-01 DIAGNOSIS — N6489 Other specified disorders of breast: Secondary | ICD-10-CM

## 2023-01-09 ENCOUNTER — Other Ambulatory Visit: Payer: Self-pay | Admitting: Family Medicine

## 2023-02-07 ENCOUNTER — Encounter (INDEPENDENT_AMBULATORY_CARE_PROVIDER_SITE_OTHER): Payer: Self-pay

## 2023-02-22 DIAGNOSIS — R051 Acute cough: Secondary | ICD-10-CM | POA: Diagnosis not present

## 2023-02-22 DIAGNOSIS — R059 Cough, unspecified: Secondary | ICD-10-CM | POA: Diagnosis not present

## 2023-02-22 DIAGNOSIS — Z6836 Body mass index (BMI) 36.0-36.9, adult: Secondary | ICD-10-CM | POA: Diagnosis not present

## 2023-02-22 DIAGNOSIS — U071 COVID-19: Secondary | ICD-10-CM | POA: Diagnosis not present

## 2023-03-09 ENCOUNTER — Encounter: Payer: Self-pay | Admitting: Family Medicine

## 2023-03-09 ENCOUNTER — Ambulatory Visit: Payer: BC Managed Care – PPO | Admitting: Family Medicine

## 2023-03-20 ENCOUNTER — Ambulatory Visit: Payer: BC Managed Care – PPO | Admitting: Family Medicine

## 2023-03-20 NOTE — Progress Notes (Unsigned)
   Rubin Payor, PhD, LAT, ATC acting as a scribe for Clementeen Graham, MD.  [name] [name] is a 57 y.o. female who presents to [facility] today for R foot pain. Pt was previously seen by Dr. Denyse Amass in 2021-22 for a nodule on her L hand.  Today, pt c/o R foot pain x ***. She describes the pain as a "burning" sensation. Pt locates pain to ***  Low back pain: Radiates: Aggravates: Treatments tried:  Pertinent review of systems: ***  Relevant historical information: ***   Exam:  There were no vitals taken for this visit. General: Well Developed, well nourished, and in no acute distress.   MSK: ***    Lab and Radiology Results No results found for this or any previous visit (from the past 72 hour(s)). No results found.     Assessment and Plan: 57 y.o. female with ***   PDMP not reviewed this encounter. No orders of the defined types were placed in this encounter.  No orders of the defined types were placed in this encounter.    Discussed warning signs or symptoms. Please see discharge instructions. Patient expresses understanding.   ***

## 2023-03-21 ENCOUNTER — Ambulatory Visit: Payer: BC Managed Care – PPO | Admitting: Family Medicine

## 2023-03-21 ENCOUNTER — Encounter: Payer: Self-pay | Admitting: Family Medicine

## 2023-03-21 ENCOUNTER — Ambulatory Visit (INDEPENDENT_AMBULATORY_CARE_PROVIDER_SITE_OTHER): Payer: BC Managed Care – PPO

## 2023-03-21 VITALS — BP 160/92 | HR 80 | Ht 66.75 in | Wt 235.0 lb

## 2023-03-21 DIAGNOSIS — M7731 Calcaneal spur, right foot: Secondary | ICD-10-CM | POA: Diagnosis not present

## 2023-03-21 DIAGNOSIS — M19071 Primary osteoarthritis, right ankle and foot: Secondary | ICD-10-CM | POA: Diagnosis not present

## 2023-03-21 DIAGNOSIS — M2011 Hallux valgus (acquired), right foot: Secondary | ICD-10-CM | POA: Diagnosis not present

## 2023-03-21 DIAGNOSIS — M79671 Pain in right foot: Secondary | ICD-10-CM

## 2023-03-21 NOTE — Patient Instructions (Addendum)
Thank you for coming in today.   Please get an Xray today before you leave   Please complete the exercises that the athletic trainer went over with you:  View at [email or web address]-[email or web address] using code: [email or web address]  Please use Voltaren gel (Generic Diclofenac Gel) up to 4x daily for pain as needed.  This is available over-the-counter as both the name brand Voltaren gel and the generic diclofenac gel.   If not better we can order a Nerve Conduction Study and refer you to Physical therapy

## 2023-04-02 NOTE — Progress Notes (Signed)
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

## 2023-04-10 ENCOUNTER — Ambulatory Visit
Admission: RE | Admit: 2023-04-10 | Discharge: 2023-04-10 | Disposition: A | Discharge status: Home / Self Care | Payer: BC Managed Care – PPO | Source: Ambulatory Visit | Attending: Family Medicine | Admitting: Family Medicine

## 2023-04-10 DIAGNOSIS — N6489 Other specified disorders of breast: Secondary | ICD-10-CM

## 2023-04-10 DIAGNOSIS — R928 Other abnormal and inconclusive findings on diagnostic imaging of breast: Secondary | ICD-10-CM | POA: Diagnosis not present

## 2023-09-09 ENCOUNTER — Other Ambulatory Visit: Payer: Self-pay | Admitting: Family Medicine

## 2023-09-11 ENCOUNTER — Other Ambulatory Visit: Payer: Self-pay | Admitting: Family Medicine

## 2023-10-22 ENCOUNTER — Other Ambulatory Visit: Payer: Self-pay | Admitting: Family Medicine

## 2023-12-11 ENCOUNTER — Encounter: Payer: Self-pay | Admitting: Family Medicine

## 2023-12-11 ENCOUNTER — Encounter: Payer: Self-pay | Admitting: Obstetrics and Gynecology

## 2023-12-11 ENCOUNTER — Ambulatory Visit: Admitting: Family Medicine

## 2023-12-11 ENCOUNTER — Ambulatory Visit: Payer: Self-pay | Admitting: Family Medicine

## 2023-12-11 ENCOUNTER — Ambulatory Visit (INDEPENDENT_AMBULATORY_CARE_PROVIDER_SITE_OTHER): Admitting: Obstetrics and Gynecology

## 2023-12-11 VITALS — BP 124/72 | HR 87 | Ht 68.0 in | Wt 236.0 lb

## 2023-12-11 VITALS — BP 132/76 | HR 77 | Temp 98.2°F | Ht 68.0 in | Wt 240.0 lb

## 2023-12-11 DIAGNOSIS — I10 Essential (primary) hypertension: Secondary | ICD-10-CM

## 2023-12-11 DIAGNOSIS — Z131 Encounter for screening for diabetes mellitus: Secondary | ICD-10-CM | POA: Diagnosis not present

## 2023-12-11 DIAGNOSIS — N958 Other specified menopausal and perimenopausal disorders: Secondary | ICD-10-CM | POA: Diagnosis not present

## 2023-12-11 DIAGNOSIS — E669 Obesity, unspecified: Secondary | ICD-10-CM

## 2023-12-11 DIAGNOSIS — Z01419 Encounter for gynecological examination (general) (routine) without abnormal findings: Secondary | ICD-10-CM | POA: Insufficient documentation

## 2023-12-11 DIAGNOSIS — Z1331 Encounter for screening for depression: Secondary | ICD-10-CM

## 2023-12-11 DIAGNOSIS — E785 Hyperlipidemia, unspecified: Secondary | ICD-10-CM | POA: Diagnosis not present

## 2023-12-11 DIAGNOSIS — E559 Vitamin D deficiency, unspecified: Secondary | ICD-10-CM

## 2023-12-11 DIAGNOSIS — Z780 Asymptomatic menopausal state: Secondary | ICD-10-CM

## 2023-12-11 LAB — LIPID PANEL
Cholesterol: 189 mg/dL (ref 0–200)
HDL: 47.9 mg/dL (ref >39.00)
LDL Cholesterol: 121 mg/dL — ABNORMAL HIGH (ref 0–99)
NonHDL: 141.52
Total CHOL/HDL Ratio: 4
Triglycerides: 105 mg/dL (ref 0.0–149.0)
VLDL: 21 mg/dL (ref 0.0–40.0)

## 2023-12-11 LAB — CBC WITH DIFFERENTIAL/PLATELET
Basophils Absolute: 0 10*3/uL (ref 0.0–0.1)
Basophils Relative: 0.6 % (ref 0.0–3.0)
Eosinophils Absolute: 0.2 10*3/uL (ref 0.0–0.7)
Eosinophils Relative: 3.8 % (ref 0.0–5.0)
HCT: 39.6 % (ref 36.0–46.0)
Hemoglobin: 12.9 g/dL (ref 12.0–15.0)
Lymphocytes Relative: 50.5 % — ABNORMAL HIGH (ref 12.0–46.0)
Lymphs Abs: 2.5 10*3/uL (ref 0.7–4.0)
MCHC: 32.7 g/dL (ref 30.0–36.0)
MCV: 94.3 fl (ref 78.0–100.0)
Monocytes Absolute: 0.3 10*3/uL (ref 0.1–1.0)
Monocytes Relative: 6.3 % (ref 3.0–12.0)
Neutro Abs: 1.9 10*3/uL (ref 1.4–7.7)
Neutrophils Relative %: 38.8 % — ABNORMAL LOW (ref 43.0–77.0)
Platelets: 242 10*3/uL (ref 150.0–400.0)
RBC: 4.2 Mil/uL (ref 3.87–5.11)
RDW: 13.1 % (ref 11.5–15.5)
WBC: 5 10*3/uL (ref 4.0–10.5)

## 2023-12-11 LAB — COMPREHENSIVE METABOLIC PANEL WITH GFR
ALT: 26 U/L (ref 0–35)
AST: 22 U/L (ref 0–37)
Albumin: 4.3 g/dL (ref 3.5–5.2)
Alkaline Phosphatase: 70 U/L (ref 39–117)
BUN: 10 mg/dL (ref 6–23)
CO2: 30 meq/L (ref 19–32)
Calcium: 9.3 mg/dL (ref 8.4–10.5)
Chloride: 101 meq/L (ref 96–112)
Creatinine, Ser: 0.69 mg/dL (ref 0.40–1.20)
GFR: 95.78 mL/min (ref >60.00)
Glucose, Bld: 99 mg/dL (ref 70–99)
Potassium: 3.6 meq/L (ref 3.5–5.1)
Sodium: 138 meq/L (ref 135–145)
Total Bilirubin: 0.4 mg/dL (ref 0.2–1.2)
Total Protein: 7.7 g/dL (ref 6.0–8.3)

## 2023-12-11 LAB — VITAMIN D 25 HYDROXY (VIT D DEFICIENCY, FRACTURES): VITD: 38.34 ng/mL (ref 30.00–100.00)

## 2023-12-11 LAB — HEMOGLOBIN A1C: Hgb A1c MFr Bld: 6.4 % (ref 4.6–6.5)

## 2023-12-11 MED ORDER — IMVEXXY MAINTENANCE PACK 10 MCG VA INST
VAGINAL_INSERT | VAGINAL | 3 refills | Status: AC
Start: 2023-12-11 — End: ?

## 2023-12-11 MED ORDER — AMLODIPINE BESYLATE 10 MG PO TABS: 10.0000 mg | ORAL_TABLET | Freq: Every day | ORAL | 3 refills | Status: AC

## 2023-12-11 MED ORDER — IMVEXXY STARTER PACK 10 MCG VA INST
VAGINAL_INSERT | VAGINAL | 0 refills | Status: AC
Start: 2023-12-11 — End: ?

## 2023-12-11 NOTE — Progress Notes (Signed)
 Phone [phone] In person visit   Subjective:   [name] [name] is a 58 y.o. 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 24 each 3   Estradiol Starter Pack (IMVEXXY STARTER PACK) 10 MCG INST Insert 1 tablet nightly into the vagina for 2 weeks.  Then insert 1 tablet nightly into the vagina twice a week. 18 each 0   Vitamin D , Ergocalciferol , (DRISDOL ) 1.25 MG (50000 UNIT) CAPS capsule TAKE 1 CAPSULE (50,000 UNITS TOTAL) BY MOUTH EVERY 7 (SEVEN) DAYS 12 capsule 1   VITAMIN K PO Take by mouth.     amLODipine  (NORVASC ) 10 MG tablet Take 1 tablet (10 mg total) by mouth daily. 90 tablet 3   No current facility-administered medications for this visit.     Objective:  BP 132/76   Pulse 77   Temp 98.2 F (36.8 C)   Ht 5\' 8"  (1.727 m)   Wt 240 lb (108.9 kg)   SpO2 97%   BMI 36.49 kg/m  Gen: NAD, resting comfortably CV: RRR no murmurs rubs or gallops Lungs: CTAB no crackles, wheeze, rhonchi Ext: no edema Skin: warm, dry     Assessment and Plan   # Postmenopausal/vaginal dryness # Obesity S: Patient saw GYN this morning Dr. Andrena Ke and was started on vaginal estrogen for vaginal dryness.   She has noted difficulty with weight  gain post menopause. Doesn't eat after 6. Milkshake once a week- half price at [employer] after 5.  -trying to get out to [place]- has gotten up to 2.5 miles -has not tried calorie count Wt Readings from Last 3 Encounters:  12/11/23 240 lb (108.9 kg)  12/11/23 236 lb (107 kg)  03/21/23 235 lb (106.6 kg)    A/P: In regards to her vaginal dryness-already has appropriate treatment prescribed by Dr. Andrena Ke - Her primary concern to discuss with me today was weight gain postmenopause.  We discussed multiple options such as orlistat, phentermine not ideal with her blood pressure, Zepbound but after reviewed side effects she opted out.  She was focused on healthy eating/regular exercise and we discussed potentially using my fitness pal and to give her some recommendations   # Hypertension  S:Medication: Amlodipine  10 mg   BP Readings from Last 3 Encounters:  12/11/23 132/76  12/11/23 124/72  03/21/23 (!) 160/92   A/P: well controlled continue current medications - refill today    #hyperlipidemia-CT calcium scoring with calcifications on aortic valve-score of 0.  S: Medication: None Lab Results  Component Value Date   CHOL 203 (H) 09/01/2022   HDL 51.10 09/01/2022   LDLCALC 130 (H) 09/01/2022   TRIG 108.0 09/01/2022   CHOLHDL 4 09/01/2022   A/P: Elevation somewhat mild especially with CT calcium  score of 0 even with calcifications on the aortic valve-update lipid panel and calculate ASCVD risk-I think she can get this down with healthy eating/exercise/weight loss    # Vitamin D  deficiency  S:Medication: off since march- k2/vitamin D  - 4500 units she thinks Lab Results  Component Value Date   VD25OH 30.23 09/01/2022  A/P: Has been off medication lately-hopefully stable  Recommended follow up: Return in about 1 year (around 12/10/2024) for physical or sooner if needed.Schedule b4 you leave. Future Appointments  Date Time Provider Department Center  12/11/2024  8:00 AM Romaine Closs,  MD [facility] None  12/11/2024  9:20 AM Almira Jaeger, MD [facility]    Lab/Order associations: only biscuit and frieds 9 30- check labs    ICD-10-CM   1. Hypertension, essential  I10 Comprehensive metabolic panel with GFR    CBC with Differential/Platelet    Lipid panel    2. Hyperlipidemia, unspecified hyperlipidemia type  E78.5 Comprehensive metabolic panel with GFR    CBC with Differential/Platelet    Lipid panel    3. Vitamin D  deficiency  E55.9 VITAMIN D  25 Hydroxy (Vit-D Deficiency, Fractures)    4. Obesity, unspecified class, unspecified obesity type, unspecified whether serious comorbidity present  E66.9 Hemoglobin A1c    5. Screening for diabetes mellitus  Z13.1 Hemoglobin A1c      Meds ordered this encounter  Medications   amLODipine  (NORVASC ) 10 MG tablet    Sig: Take 1 tablet (10 mg total) by mouth daily.    Dispense:  90 tablet    Refill:  3    Return precautions advised.  Clarisa Crooked, MD

## 2023-12-11 NOTE — Assessment & Plan Note (Signed)
Reviewed safety profile of low dose vaginal estrogen, however reviewed that higher doses have been associated with DVT, breast and uterine cancer.

## 2023-12-11 NOTE — Assessment & Plan Note (Signed)
 Cervical cancer screening performed according to ASCCP guidelines. Encouraged annual mammogram screening Colonoscopy UTD DXA N/A Labs and immunizations with her primary Encouraged safe sexual practices as indicated Encouraged healthy lifestyle practices with diet and exercise For patients under 50-58yo, I recommend 1200mg  calcium daily and 600IU of vitamin D daily.

## 2023-12-11 NOTE — Patient Instructions (Addendum)
 Eligible for shingles when ready  Please stop by lab before you go If you have mychart- we will send your results within 3 business days of us  receiving them.  If you do not have mychart- we will call you about results within 5 business days of us  receiving them.  *please also note that you will see labs on mychart as soon as they post. I will later go in and write notes on them- will say "notes from Dr. Arlene Ben"   I suggest myfitnesspal Use 0.5 pounds per week weight loss goal (or up to 1 pounds if needed) Set a reasonable goal such as 5-10 lbs and can reset goal once you reach the goal Do not connect your step counter to this- watch or phone Update me in 2-3 months with how you are doing  MWF exercise goal at least 30-45 minutes. I'm excited for your changes!   Recommended follow up: Return in about 1 year (around 12/10/2024) for physical or sooner if needed.Schedule b4 you leave.

## 2023-12-11 NOTE — Patient Instructions (Signed)

## 2023-12-11 NOTE — Progress Notes (Signed)
 58 y.o. [id number] female s/p hysterectomy with HTN here for annual exam. Married.  No LMP recorded. Patient has had a hysterectomy.   Discuss menopause: weight gain, low libido, vaginal irritations with IC. Mild hot flashes. She is not interested in improving libido. Eats a healthy diet, however is not exercising and is noticing increasing weight gain. She is interested in weight loss medications. She is seeing her PCP this week to discuss.  Abnormal bleeding: none Pelvic discharge or pain: none Breast mass, nipple discharge or skin changes : none  Last PAP: No results found for: "DIAGPAP", "HPVHIGH", "ADEQPAP" Last mammogram: 04/10/23 BI-RADS 3, density B Last colonoscopy: 07/23/17 Sexually active: yes  Exercising: on Smoker: no  Garment/textile technologist Visit from 12/11/2023 in [facility]  PHQ-2 Total Score 0       Flowsheet Row Office Visit from 09/01/2022 in [facility]  PHQ-9 Total Score 0        GYN HISTORY: No sig hx  OB History  Gravida Para Term Preterm AB Living  4 2 2  2 2   SAB IAB Ectopic Multiple Live Births   2   2    # Outcome Date GA Lbr Len/2nd Weight Sex Type Anes PTL Lv  4 IAB           3 IAB           2 Term           1 Term            Past Medical History:  Diagnosis Date   Allergy    Back pain    Hyperlipidemia    not on meds trying diet and exercise   Hypertension    not on meds  diet and exercise   Joint pain    Obesity    Past Surgical History:  Procedure Laterality Date   ABDOMINAL HYSTERECTOMY     fibroids. Dr. Rafaela Bunch including cervix   ABDOMINAL SURGERY     abdominoplasty- in late 30's    CESAREAN SECTION     x2, 25 and 87   CHOLECYSTECTOMY     tummy tuck     Current Outpatient Medications on File Prior to Visit  Medication Sig Dispense Refill   amLODipine  (NORVASC ) 10 MG tablet TAKE 1 TABLET BY MOUTH EVERY DAY 90 tablet 3   Vitamin D , Ergocalciferol ,  (DRISDOL ) 1.25 MG (50000 UNIT) CAPS capsule TAKE 1 CAPSULE (50,000 UNITS TOTAL) BY MOUTH EVERY 7 (SEVEN) DAYS 12 capsule 1   VITAMIN K PO Take by mouth.     No current facility-administered medications on file prior to visit.   Social History   Socioeconomic History   Marital status: Married    Spouse name: Josefina Nian   Number of children: Not on file   Years of education: Not on file   Highest education level: Not on file  Occupational History   Occupation: CNA 1  Tobacco Use   Smoking status: Never   Smokeless tobacco: Never  Vaping Use   Vaping status: Never Used  Substance and Sexual Activity   Alcohol use: Yes    Comment: 1 a day   Drug use: No   Sexual activity: Yes    Partners: Male    Birth control/protection: Surgical    Comment: 1st intercourse- 16, partners- ?, hysterectomy  Other Topics Concern   Not on file  Social History Narrative   remarried to  Alton Jewel around 2017.  Divorced. 2 children. 1 grandchild Lanell Pinta born in 2016).       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 [employer]: ride motorcycle, reading, walking   Social Drivers of Corporate investment banker Strain: Not on [employer] Insecurity: Not on file  Transportation Needs: Not on file  Physical Activity: Not on file  Stress: Not on file  Social Connections: Not on file  Intimate Partner Violence: Not on file   Family History  Problem Relation Age of Onset   Heart disease Mother        in early 38s, nonsmoker   Heart attack Mother    Hypertension Mother    Diabetes Mother    Liver disease Father    Drug abuse Father    Diabetes Sister    Allergies  Allergen Reactions   Nature conservation officer (Non-Screening) Hives and Itching    shrimp     PE Today's Vitals   12/11/23 0756  BP: 124/72  Pulse: 87  SpO2: 99%  Weight: 236 lb (107 kg)  Height: 5\' 8"  (1.727 m)   Body mass index is 35.88 kg/m.  Physical Exam Vitals reviewed. Exam conducted with a  chaperone present.  Constitutional:      General: She is not in acute distress.    Appearance: Normal appearance.  HENT:     Head: Normocephalic and atraumatic.     Nose: Nose normal.  Eyes:     Extraocular Movements: Extraocular movements intact.     Conjunctiva/sclera: Conjunctivae normal.  Neck:     Thyroid : No thyroid  mass, thyromegaly or thyroid  tenderness.  Pulmonary:     Effort: Pulmonary effort is normal.  Chest:     Chest wall: No mass or tenderness.  Breasts:    Right: Normal. No swelling, mass, nipple discharge or tenderness.     Left: Normal. No swelling, mass, nipple discharge or tenderness.  Abdominal:     General: There is no distension.     Palpations: Abdomen is soft.     Tenderness: There is no abdominal tenderness.  Genitourinary:    General: Normal vulva.     Exam position: Lithotomy position.     Urethra: No prolapse.     Vagina: Normal. No vaginal discharge or bleeding.     Cervix: No lesion.     Adnexa: Right adnexa normal and left adnexa normal.     Comments: Cervix and uterus absent Musculoskeletal:        General: Normal range of motion.     Cervical back: Normal range of motion.  Lymphadenopathy:     Upper Body:     Right upper body: No axillary adenopathy.     Left upper body: No axillary adenopathy.     Lower Body: No right inguinal adenopathy. No left inguinal adenopathy.  Skin:    General: Skin is warm and dry.  Neurological:     General: No focal deficit present.     Mental Status: She is alert.  Psychiatric:        Mood and Affect: Mood normal.        Behavior: Behavior normal.       Assessment and Plan:        Well woman exam with routine gynecological exam Assessment & Plan: Cervical cancer screening performed according to ASCCP guidelines. Encouraged annual mammogram screening Colonoscopy UTD DXA N/A Labs and immunizations with her primary  Encouraged safe sexual practices as indicated Encouraged healthy lifestyle  practices with diet and exercise For patients under 50-70yo, I recommend 1200mg  calcium daily and 600IU of vitamin D  daily.    Negative depression screening  Genitourinary syndrome of menopause Assessment & Plan: Reviewed safety profile of low dose vaginal estrogen, however reviewed that higher doses have been associated with DVT, breast and uterine cancer.    Orders: -     Imvexxy Maintenance Pack; Insert 1 tablet nightly into the vagina twice a week.  Dispense: 24 each; Refill: 3 -     Imvexxy Starter Pack; Insert 1 tablet nightly into the vagina for 2 weeks.  Then insert 1 tablet nightly into the vagina twice a week.  Dispense: 18 each; Refill: 0  Postmenopause  Lifestyle modifications for VMS were reviewed, including eliminating caffeine and OTC supplements. In discussing weight gain related to perimenopause, she was counseled on proper nutrition, regular strength training, improve sleep.  Also discussed multifactorial etiologies for low libido. Medical therapies include testosterone placement and wellbutrin. She declined both at this time.  Romaine Closs, MD

## 2024-05-05 ENCOUNTER — Other Ambulatory Visit: Payer: Self-pay | Admitting: Family Medicine

## 2024-05-05 DIAGNOSIS — N6489 Other specified disorders of breast: Secondary | ICD-10-CM

## 2024-05-28 ENCOUNTER — Ambulatory Visit
Admission: RE | Admit: 2024-05-28 | Discharge: 2024-05-28 | Disposition: A | Source: Ambulatory Visit | Attending: Family Medicine | Admitting: Family Medicine

## 2024-05-28 DIAGNOSIS — N6489 Other specified disorders of breast: Secondary | ICD-10-CM

## 2024-05-28 DIAGNOSIS — R92323 Mammographic fibroglandular density, bilateral breasts: Secondary | ICD-10-CM | POA: Diagnosis not present

## 2024-05-28 DIAGNOSIS — R928 Other abnormal and inconclusive findings on diagnostic imaging of breast: Secondary | ICD-10-CM | POA: Diagnosis not present

## 2024-12-11 ENCOUNTER — Encounter: Admitting: Family Medicine

## 2024-12-11 ENCOUNTER — Ambulatory Visit: Admitting: Obstetrics and Gynecology
